# Patient Record
Sex: Male | Born: 1950 | Race: White | Hispanic: No | Marital: Single | Smoking: Never smoker
Health system: Southern US, Community
[De-identification: ages and names within clinical notes are randomized; demographics above are authoritative.]

---

## 2020-03-07 ENCOUNTER — Encounter (HOSPITAL_COMMUNITY): Payer: Self-pay | Admitting: Emergency Medicine

## 2020-03-07 ENCOUNTER — Inpatient Hospital Stay (HOSPITAL_COMMUNITY)
Admission: EM | Admit: 2020-03-07 | Discharge: 2020-03-15 | DRG: 853 | Disposition: A | Discharge status: Home / Self Care | Payer: Self-pay | Attending: Internal Medicine | Admitting: Internal Medicine

## 2020-03-07 ENCOUNTER — Emergency Department (HOSPITAL_COMMUNITY): Payer: Self-pay

## 2020-03-07 ENCOUNTER — Other Ambulatory Visit: Payer: Self-pay

## 2020-03-07 DIAGNOSIS — K8050 Calculus of bile duct without cholangitis or cholecystitis without obstruction: Secondary | ICD-10-CM

## 2020-03-07 DIAGNOSIS — R748 Abnormal levels of other serum enzymes: Secondary | ICD-10-CM | POA: Diagnosis present

## 2020-03-07 DIAGNOSIS — J150 Pneumonia due to Klebsiella pneumoniae: Secondary | ICD-10-CM | POA: Diagnosis present

## 2020-03-07 DIAGNOSIS — K651 Peritoneal abscess: Secondary | ICD-10-CM | POA: Diagnosis present

## 2020-03-07 DIAGNOSIS — E871 Hypo-osmolality and hyponatremia: Secondary | ICD-10-CM | POA: Diagnosis present

## 2020-03-07 DIAGNOSIS — A419 Sepsis, unspecified organism: Secondary | ICD-10-CM

## 2020-03-07 DIAGNOSIS — I10 Essential (primary) hypertension: Secondary | ICD-10-CM | POA: Diagnosis present

## 2020-03-07 DIAGNOSIS — K8689 Other specified diseases of pancreas: Secondary | ICD-10-CM

## 2020-03-07 DIAGNOSIS — E872 Acidosis: Secondary | ICD-10-CM | POA: Diagnosis present

## 2020-03-07 DIAGNOSIS — R739 Hyperglycemia, unspecified: Secondary | ICD-10-CM | POA: Diagnosis present

## 2020-03-07 DIAGNOSIS — B9689 Other specified bacterial agents as the cause of diseases classified elsewhere: Secondary | ICD-10-CM | POA: Diagnosis present

## 2020-03-07 DIAGNOSIS — L8990 Pressure ulcer of unspecified site, unspecified stage: Secondary | ICD-10-CM | POA: Diagnosis present

## 2020-03-07 DIAGNOSIS — B961 Klebsiella pneumoniae [K. pneumoniae] as the cause of diseases classified elsewhere: Secondary | ICD-10-CM | POA: Diagnosis present

## 2020-03-07 DIAGNOSIS — K5900 Constipation, unspecified: Secondary | ICD-10-CM | POA: Diagnosis present

## 2020-03-07 DIAGNOSIS — N179 Acute kidney failure, unspecified: Secondary | ICD-10-CM | POA: Diagnosis present

## 2020-03-07 DIAGNOSIS — R4182 Altered mental status, unspecified: Secondary | ICD-10-CM

## 2020-03-07 DIAGNOSIS — A4159 Other Gram-negative sepsis: Principal | ICD-10-CM | POA: Diagnosis present

## 2020-03-07 DIAGNOSIS — E861 Hypovolemia: Secondary | ICD-10-CM | POA: Diagnosis present

## 2020-03-07 DIAGNOSIS — E876 Hypokalemia: Secondary | ICD-10-CM | POA: Diagnosis present

## 2020-03-07 DIAGNOSIS — K838 Other specified diseases of biliary tract: Secondary | ICD-10-CM | POA: Diagnosis present

## 2020-03-07 DIAGNOSIS — R6521 Severe sepsis with septic shock: Secondary | ICD-10-CM | POA: Diagnosis present

## 2020-03-07 DIAGNOSIS — R579 Shock, unspecified: Secondary | ICD-10-CM | POA: Diagnosis present

## 2020-03-07 DIAGNOSIS — K8012 Calculus of gallbladder with acute and chronic cholecystitis without obstruction: Secondary | ICD-10-CM | POA: Diagnosis present

## 2020-03-07 DIAGNOSIS — Z20822 Contact with and (suspected) exposure to covid-19: Secondary | ICD-10-CM | POA: Diagnosis present

## 2020-03-07 DIAGNOSIS — E860 Dehydration: Secondary | ICD-10-CM | POA: Diagnosis present

## 2020-03-07 DIAGNOSIS — R188 Other ascites: Secondary | ICD-10-CM | POA: Diagnosis present

## 2020-03-07 DIAGNOSIS — R7881 Bacteremia: Secondary | ICD-10-CM

## 2020-03-07 DIAGNOSIS — D649 Anemia, unspecified: Secondary | ICD-10-CM | POA: Diagnosis present

## 2020-03-07 DIAGNOSIS — R571 Hypovolemic shock: Secondary | ICD-10-CM | POA: Diagnosis present

## 2020-03-07 DIAGNOSIS — F1010 Alcohol abuse, uncomplicated: Secondary | ICD-10-CM

## 2020-03-07 DIAGNOSIS — K812 Acute cholecystitis with chronic cholecystitis: Secondary | ICD-10-CM

## 2020-03-07 DIAGNOSIS — Z66 Do not resuscitate: Secondary | ICD-10-CM | POA: Diagnosis present

## 2020-03-07 DIAGNOSIS — D6959 Other secondary thrombocytopenia: Secondary | ICD-10-CM | POA: Diagnosis present

## 2020-03-07 DIAGNOSIS — K660 Peritoneal adhesions (postprocedural) (postinfection): Secondary | ICD-10-CM | POA: Diagnosis present

## 2020-03-07 LAB — URINALYSIS, ROUTINE W REFLEX MICROSCOPIC
Bilirubin Urine: NEGATIVE
Glucose, UA: NEGATIVE mg/dL
Ketones, ur: NEGATIVE mg/dL
Leukocytes,Ua: NEGATIVE
Nitrite: NEGATIVE
Protein, ur: 30 mg/dL — AB
Specific Gravity, Urine: 1.015 (ref 1.005–1.030)
pH: 5 (ref 5.0–8.0)

## 2020-03-07 LAB — CBC WITH DIFFERENTIAL/PLATELET
Abs Immature Granulocytes: 0.1 10*3/uL — ABNORMAL HIGH (ref 0.00–0.07)
Band Neutrophils: 21 %
Basophils Absolute: 0.1 10*3/uL (ref 0.0–0.1)
Basophils Relative: 1 %
Eosinophils Absolute: 0 10*3/uL (ref 0.0–0.5)
Eosinophils Relative: 0 %
HCT: 44 % (ref 39.0–52.0)
Hemoglobin: 14.6 g/dL (ref 13.0–17.0)
Lymphocytes Relative: 2 %
Lymphs Abs: 0.2 10*3/uL — ABNORMAL LOW (ref 0.7–4.0)
MCH: 30.1 pg (ref 26.0–34.0)
MCHC: 33.2 g/dL (ref 30.0–36.0)
MCV: 90.7 fL (ref 80.0–100.0)
Metamyelocytes Relative: 1 %
Monocytes Absolute: 0.4 10*3/uL (ref 0.1–1.0)
Monocytes Relative: 5 %
Neutro Abs: 7.5 10*3/uL (ref 1.7–7.7)
Neutrophils Relative %: 70 %
Platelets: 287 10*3/uL (ref 150–400)
RBC: 4.85 MIL/uL (ref 4.22–5.81)
RDW: 12.8 % (ref 11.5–15.5)
WBC Morphology: INCREASED
WBC: 8.2 10*3/uL (ref 4.0–10.5)
nRBC: 0 % (ref 0.0–0.2)

## 2020-03-07 LAB — COMPREHENSIVE METABOLIC PANEL
ALT: 434 U/L — ABNORMAL HIGH (ref 0–44)
ALT: 231 U/L — ABNORMAL HIGH (ref 0–44)
AST: 275 U/L — ABNORMAL HIGH (ref 15–41)
AST: 137 U/L — ABNORMAL HIGH (ref 15–41)
Albumin: 3.3 g/dL — ABNORMAL LOW (ref 3.5–5.0)
Albumin: 2.2 g/dL — ABNORMAL LOW (ref 3.5–5.0)
Alkaline Phosphatase: 160 U/L — ABNORMAL HIGH (ref 38–126)
Alkaline Phosphatase: 112 U/L (ref 38–126)
Anion gap: 24 — ABNORMAL HIGH (ref 5–15)
Anion gap: 14 (ref 5–15)
BUN: 43 mg/dL — ABNORMAL HIGH (ref 8–23)
BUN: 43 mg/dL — ABNORMAL HIGH (ref 8–23)
CO2: 17 mmol/L — ABNORMAL LOW (ref 22–32)
CO2: 21 mmol/L — ABNORMAL LOW (ref 22–32)
Calcium: 8.3 mg/dL — ABNORMAL LOW (ref 8.9–10.3)
Calcium: 7.4 mg/dL — ABNORMAL LOW (ref 8.9–10.3)
Chloride: 82 mmol/L — ABNORMAL LOW (ref 98–111)
Chloride: 91 mmol/L — ABNORMAL LOW (ref 98–111)
Creatinine, Ser: 1.98 mg/dL — ABNORMAL HIGH (ref 0.61–1.24)
Creatinine, Ser: 1.67 mg/dL — ABNORMAL HIGH (ref 0.61–1.24)
GFR calc Af Amer: 39 mL/min — ABNORMAL LOW (ref >60)
GFR calc Af Amer: 48 mL/min — ABNORMAL LOW (ref >60)
GFR calc non Af Amer: 34 mL/min — ABNORMAL LOW (ref >60)
GFR calc non Af Amer: 42 mL/min — ABNORMAL LOW (ref >60)
Glucose, Bld: 167 mg/dL — ABNORMAL HIGH (ref 70–99)
Glucose, Bld: 111 mg/dL — ABNORMAL HIGH (ref 70–99)
Potassium: 3.3 mmol/L — ABNORMAL LOW (ref 3.5–5.1)
Potassium: 3 mmol/L — ABNORMAL LOW (ref 3.5–5.1)
Sodium: 123 mmol/L — ABNORMAL LOW (ref 135–145)
Sodium: 126 mmol/L — ABNORMAL LOW (ref 135–145)
Total Bilirubin: 6.8 mg/dL — ABNORMAL HIGH (ref 0.3–1.2)
Total Bilirubin: 3.1 mg/dL — ABNORMAL HIGH (ref 0.3–1.2)
Total Protein: 7.4 g/dL (ref 6.5–8.1)
Total Protein: 5.2 g/dL — ABNORMAL LOW (ref 6.5–8.1)

## 2020-03-07 LAB — I-STAT CHEM 8, ED
BUN: 40 mg/dL — ABNORMAL HIGH (ref 8–23)
Calcium, Ion: 0.83 mmol/L — CL (ref 1.15–1.40)
Chloride: 91 mmol/L — ABNORMAL LOW (ref 98–111)
Creatinine, Ser: 1.7 mg/dL — ABNORMAL HIGH (ref 0.61–1.24)
Glucose, Bld: 91 mg/dL (ref 70–99)
HCT: 38 % — ABNORMAL LOW (ref 39.0–52.0)
Hemoglobin: 12.9 g/dL — ABNORMAL LOW (ref 13.0–17.0)
Potassium: 2.9 mmol/L — ABNORMAL LOW (ref 3.5–5.1)
Sodium: 125 mmol/L — ABNORMAL LOW (ref 135–145)
TCO2: 15 mmol/L — ABNORMAL LOW (ref 22–32)

## 2020-03-07 LAB — CBC
HCT: 35.2 % — ABNORMAL LOW (ref 39.0–52.0)
Hemoglobin: 12.3 g/dL — ABNORMAL LOW (ref 13.0–17.0)
MCH: 31.1 pg (ref 26.0–34.0)
MCHC: 34.9 g/dL (ref 30.0–36.0)
MCV: 89.1 fL (ref 80.0–100.0)
Platelets: 146 10*3/uL — ABNORMAL LOW (ref 150–400)
RBC: 3.95 MIL/uL — ABNORMAL LOW (ref 4.22–5.81)
RDW: 12.8 % (ref 11.5–15.5)
WBC: 13.8 10*3/uL — ABNORMAL HIGH (ref 4.0–10.5)
nRBC: 0 % (ref 0.0–0.2)

## 2020-03-07 LAB — LACTIC ACID, PLASMA
Lactic Acid, Venous: 8.5 mmol/L (ref 0.5–1.9)
Lactic Acid, Venous: 5 mmol/L (ref 0.5–1.9)
Lactic Acid, Venous: 3.4 mmol/L (ref 0.5–1.9)
Lactic Acid, Venous: 3 mmol/L (ref 0.5–1.9)

## 2020-03-07 LAB — HEPATITIS PANEL, ACUTE
HCV Ab: NONREACTIVE
Hep A IgM: NONREACTIVE
Hep B C IgM: NONREACTIVE
Hepatitis B Surface Ag: NONREACTIVE

## 2020-03-07 LAB — PROTIME-INR
INR: 1.6 — ABNORMAL HIGH (ref 0.8–1.2)
Prothrombin Time: 18.7 seconds — ABNORMAL HIGH (ref 11.4–15.2)

## 2020-03-07 LAB — CREATININE, SERUM
Creatinine, Ser: 1.87 mg/dL — ABNORMAL HIGH (ref 0.61–1.24)
GFR calc Af Amer: 42 mL/min — ABNORMAL LOW (ref >60)
GFR calc non Af Amer: 36 mL/min — ABNORMAL LOW (ref >60)

## 2020-03-07 LAB — ACETAMINOPHEN LEVEL: Acetaminophen (Tylenol), Serum: <10 ug/mL — ABNORMAL LOW (ref 10–30)

## 2020-03-07 LAB — CK: Total CK: 528 U/L — ABNORMAL HIGH (ref 49–397)

## 2020-03-07 LAB — SARS CORONAVIRUS 2 BY RT PCR (HOSPITAL ORDER, PERFORMED IN ~~LOC~~ HOSPITAL LAB): SARS Coronavirus 2: NEGATIVE

## 2020-03-07 LAB — HIV ANTIBODY (ROUTINE TESTING W REFLEX): HIV Screen 4th Generation wRfx: NONREACTIVE

## 2020-03-07 LAB — APTT: aPTT: 37 seconds — ABNORMAL HIGH (ref 24–36)

## 2020-03-07 MED ORDER — ACETAMINOPHEN 325 MG PO TABS
650.0000 mg | ORAL_TABLET | Freq: Once | ORAL | Status: AC
  Administered 2020-03-07: 650 mg via ORAL
  Filled 2020-03-07: qty 2

## 2020-03-07 MED ORDER — METRONIDAZOLE IN NACL 5-0.79 MG/ML-% IV SOLN
500.0000 mg | Freq: Three times a day (TID) | INTRAVENOUS | Status: DC
  Administered 2020-03-07 – 2020-03-14 (×20): 500 mg via INTRAVENOUS
  Filled 2020-03-07 (×20): qty 100

## 2020-03-07 MED ORDER — SODIUM CHLORIDE 0.9 % IV BOLUS (SEPSIS)
1000.0000 mL | Freq: Once | INTRAVENOUS | Status: AC
  Administered 2020-03-07: 1000 mL via INTRAVENOUS

## 2020-03-07 MED ORDER — ONDANSETRON HCL 4 MG/2ML IJ SOLN: 4.0000 mg | Freq: Four times a day (QID) | INTRAMUSCULAR | Status: DC | PRN

## 2020-03-07 MED ORDER — METRONIDAZOLE IN NACL 5-0.79 MG/ML-% IV SOLN
500.0000 mg | Freq: Once | INTRAVENOUS | Status: AC
  Administered 2020-03-07: 500 mg via INTRAVENOUS
  Filled 2020-03-07: qty 100

## 2020-03-07 MED ORDER — VANCOMYCIN HCL IN DEXTROSE 1-5 GM/200ML-% IV SOLN
1000.0000 mg | Freq: Once | INTRAVENOUS | Status: DC
  Filled 2020-03-07: qty 200

## 2020-03-07 MED ORDER — SODIUM CHLORIDE 0.9 % IV BOLUS (SEPSIS)
500.0000 mL | Freq: Once | INTRAVENOUS | Status: AC
  Administered 2020-03-07: 500 mL via INTRAVENOUS

## 2020-03-07 MED ORDER — FENTANYL CITRATE (PF) 100 MCG/2ML IJ SOLN
25.0000 ug | Freq: Once | INTRAMUSCULAR | Status: AC
  Administered 2020-03-07: 25 ug via INTRAVENOUS
  Filled 2020-03-07: qty 2

## 2020-03-07 MED ORDER — VANCOMYCIN HCL 1500 MG/300ML IV SOLN
1500.0000 mg | Freq: Once | INTRAVENOUS | Status: AC
  Administered 2020-03-07: 1500 mg via INTRAVENOUS
  Filled 2020-03-07: qty 300

## 2020-03-07 MED ORDER — SODIUM CHLORIDE 0.9 % IV SOLN
2.0000 g | INTRAVENOUS | Status: DC
  Administered 2020-03-07 – 2020-03-13 (×7): 2 g via INTRAVENOUS
  Filled 2020-03-07 (×3): qty 20
  Filled 2020-03-07: qty 2
  Filled 2020-03-07: qty 20
  Filled 2020-03-07 (×2): qty 2
  Filled 2020-03-07: qty 20

## 2020-03-07 MED ORDER — SODIUM CHLORIDE 0.9 % IV BOLUS
1000.0000 mL | Freq: Once | INTRAVENOUS | Status: AC
  Administered 2020-03-07: 1000 mL via INTRAVENOUS

## 2020-03-07 MED ORDER — SODIUM CHLORIDE 0.9 % IV SOLN: 2.0000 g | Freq: Two times a day (BID) | INTRAVENOUS | Status: DC

## 2020-03-07 MED ORDER — LACTATED RINGERS IV SOLN: INTRAVENOUS | Status: AC

## 2020-03-07 MED ORDER — CALCIUM GLUCONATE-NACL 2-0.675 GM/100ML-% IV SOLN
2.0000 g | Freq: Once | INTRAVENOUS | Status: AC
  Administered 2020-03-07: 2000 mg via INTRAVENOUS
  Filled 2020-03-07: qty 100

## 2020-03-07 MED ORDER — SODIUM CHLORIDE 0.9 % IV SOLN
2.0000 g | Freq: Once | INTRAVENOUS | Status: AC
  Administered 2020-03-07: 2 g via INTRAVENOUS
  Filled 2020-03-07: qty 2

## 2020-03-07 MED ORDER — DOCUSATE SODIUM 100 MG PO CAPS: 100.0000 mg | ORAL_CAPSULE | Freq: Two times a day (BID) | ORAL | Status: DC | PRN

## 2020-03-07 MED ORDER — LACTATED RINGERS IV BOLUS
1000.0000 mL | Freq: Once | INTRAVENOUS | Status: AC
  Administered 2020-03-07: 1000 mL via INTRAVENOUS

## 2020-03-07 MED ORDER — ORAL CARE MOUTH RINSE
15.0000 mL | Freq: Two times a day (BID) | OROMUCOSAL | Status: DC
  Administered 2020-03-07 – 2020-03-15 (×10): 15 mL via OROMUCOSAL

## 2020-03-07 MED ORDER — HEPARIN SODIUM (PORCINE) 5000 UNIT/ML IJ SOLN
5000.0000 [IU] | Freq: Three times a day (TID) | INTRAMUSCULAR | Status: DC
  Administered 2020-03-07 – 2020-03-08 (×3): 5000 [IU] via SUBCUTANEOUS
  Filled 2020-03-07 (×3): qty 1

## 2020-03-07 MED ORDER — VANCOMYCIN HCL 1250 MG/250ML IV SOLN: 1250.0000 mg | INTRAVENOUS | Status: DC

## 2020-03-07 MED ORDER — POTASSIUM CHLORIDE CRYS ER 20 MEQ PO TBCR
40.0000 meq | EXTENDED_RELEASE_TABLET | Freq: Once | ORAL | Status: AC
  Administered 2020-03-07: 40 meq via ORAL
  Filled 2020-03-07: qty 2

## 2020-03-07 MED ORDER — MORPHINE SULFATE (PF) 2 MG/ML IV SOLN
2.0000 mg | INTRAVENOUS | Status: DC | PRN
  Administered 2020-03-07 – 2020-03-13 (×12): 2 mg via INTRAVENOUS
  Filled 2020-03-07 (×13): qty 1

## 2020-03-07 MED ORDER — ONDANSETRON HCL 4 MG/2ML IJ SOLN
4.0000 mg | Freq: Once | INTRAMUSCULAR | Status: AC
  Administered 2020-03-07: 4 mg via INTRAVENOUS
  Filled 2020-03-07: qty 2

## 2020-03-07 MED ORDER — NOREPINEPHRINE 4 MG/250ML-% IV SOLN
0.0000 ug/min | INTRAVENOUS | Status: DC
  Administered 2020-03-07: 2 ug/min via INTRAVENOUS
  Filled 2020-03-07 (×2): qty 250

## 2020-03-07 MED ORDER — POLYETHYLENE GLYCOL 3350 17 G PO PACK: 17.0000 g | PACK | Freq: Every day | ORAL | Status: DC | PRN

## 2020-03-07 NOTE — H&P (Signed)
NAME:  Brandon Spence, MRN:  [id number], DOB:  Jul 23, 1951, LOS: 0 ADMISSION DATE:  03/07/2020, CONSULTATION DATE:  03/07/20 REFERRING MD: Rogene Houston - EM , CHIEF COMPLAINT:  Weakness, dehydration, recent prolonged sun exposure   Brief History   69 yo presented to ED after feeling ill x several days following prolonged sun exposure. Sepsis criteria in ED, started on abx. Jaundice -- CT a/p with biliary duct dilatation, pancreatic lesion Remains somewhat hypotensive PCCM for admit  History of present illness   Brandon Spence is a 69 year old male without known PMH who presents to ED 8/15 with dark urine following progressive feelings of general unwellness, weakness, fatigue x 3 days after prolonged sun exposure. He also reported severe cramping abdominal pain that started on 8/14. The pain was diffuse and intermittent.   In ED, met sepsis criteria and was resuscitated with 30cc/kg, started on empiric abx. Initial labs reveal myriad of metabolic derrangements including Na 123 K 3.3 Co2 17 Cr 1.98 AG 24 Alk phos 160 AST 275 ALT 434 Tbili 6.8. On exam he was jaundiced and sent for CT abdomen pelvis which revealed intra and extrahepatic biliary ductal dilation, CBD measuring 32m. There is a hypoecoic lesion in the pancreatic head measuring 2.8 x 1.2cm.   Patient is complaining of fatigue, belching and some abdominal pain. Blood pressure remained 80/60 despite fluid resuscitation.   Past Medical History  [facility]   8/15 presents to ED, volume resuscitated and started on abx for possible sepsis, admitting to ICU   Consults:  GI   Procedures:    Significant Diagnostic Tests:  8/15 CT a/p noncon> intra and extrahepatic biliary ductal dilatation, common bile duct 280mwithout definite obstructing lesion. Pancreatic head with hypoechoic lesion 2.8cm x 1.2 cm (mass vs dilated portion of main pancreatic duct?) Moderate amount of fluid between stomach and liver.   Micro  Data:  8/15 SARS Cov2 neg 8/15 BCx> 8/15 UA>  Antimicrobials:  8/15 vanc 8/15 cefepime  8/15 Flagyl>> 8/15 ceftriaxone>> Interim history/subjective:  Initial improvement in BP following IVF. Soft again after pain medication administration.  Intermittent abdominal cramping.  Objective   Blood pressure 105/72, pulse (!) 102, temperature 98.9 F (37.2 C), temperature source Oral, resp. rate (!) 28, height 6' (1.829 m), weight 81.6 kg, SpO2 91 %.        Intake/Output Summary (Last 24 hours) at 03/07/2020 1646 Last data filed at 03/07/2020 1630 Gross per 24 hour  Intake 3000 ml  Output --  Net 3000 ml   Filed Weights   03/07/20 1045  Weight: 81.6 kg    Examination: General: Ill appearing adult M, reclined in ED stretcher. Fatigued appearing HENT: NCAT. Icteric sclera. Mild facial edema with sunburn  Lungs: CTA bilaterally shallow even unlabored respirations, slightly elevated RR  Cardiovascular: tachycardic rate reg rhythm. s1s2 no rgm cap refill < 3 sec Abdomen: Round, soft, No increased tenderness to palpation  Extremities: Symmetrical bulk and tone no obvious joint deformity no cyanosis or clubbing no edema Neuro: Drowsy, awakens to voice follows commands. Intermittently confused. PERRL GU: wnl Skin: blanching sunburn to face chest arms, no blistering. Jaundice of hands. + skin turgor  Resolved Hospital Problem list     Assessment & Plan:  Brandon Spence a [age]ear old male with no past medical history who presents with cramping abdominal pain, fatigue, generalized weakness and dark urine. He is found to have dilated intra/extrahepatic biliary ductal dilation along with  a 2.8 x 1.2cm lesion on the head of the pancreas.   Shock Hypovolemic shock in setting of dehydration (prolonged sun exposure ?sun poisoning), possible component of septic shock from biliary source -continue IVF. Received 30cc/kg resusc in ED per sepsis protocol. Will order additional bolus now and  continue mIVF as clinically appears dehydrated - Will order levophed which can be started low dose peripherally if needed to maintain MAP goal -MAP goal > 65  -Follow cx data -Received cefepime, flagyl and vanc in the ED. - Continue ceftriaxone and flagyl for biliary source coverage    Biliary ductal dilatation In setting of pancreatic lesion leading to hyperbilirubinemia, jaundiced appearence and elevated liver enzymes - Gastroenterology consulted for further evaluation and recommendations. Will aid in decision between MRCP vs ERCP.  -continue IVF as above  -trend LFTs, Tbili, coags  - Pain control 66m morphine IV PRN moderate pain  Anion Gap Metabolic Acidosis In setting of shock, sepsis and lactic acidosis  - IVF and antibiotics as above  Acute Kidney Injury Likely pre-renal given volume status and in setting of shock - IVF as above  Hyponatremia, hypovolemic - Fluid resuscitation as above  Hypokalemia -replete potassium as needed  Hypocalcemia - give 1g calcium gluconate  Disposition: Admit to ICU. Patient wishes to be DNR/DNI but in the event a procedure is needed he is ok with reversing his code status.  Best practice:  Diet: Sips with meds, ice chips  Pain/Anxiety/Delirium protocol (if indicated): PRN VAP protocol (if indicated):na DVT prophylaxis: SQ heparin GI prophylaxis: protonix  Glucose control: monitor Mobility: BR Code Status: DNR/DNI, will reverse code status if needed for procedures. Family Communication: pending 8/15 Disposition: admit to ICU   Labs   CBC: Recent Labs  Lab 03/07/20 1051 03/07/20 1637  WBC 8.2  --   NEUTROABS 7.5  --   HGB 14.6 12.9*  HCT 44.0 38.0*  MCV 90.7  --   PLT 287  --     Basic Metabolic Panel: Recent Labs  Lab 03/07/20 1051 03/07/20 1637  NA 123* 125*  K 3.3* 2.9*  CL 82* 91*  CO2 17*  --   GLUCOSE 167* 91  BUN 43* 40*  CREATININE 1.98* 1.70*  CALCIUM 8.3*  --    GFR: Estimated Creatinine Clearance:  46.3 mL/min (A) (by C-G formula based on SCr of 1.7 mg/dL (H)). Recent Labs  Lab 03/07/20 1051 03/07/20 1322  WBC 8.2  --   LATICACIDVEN 8.5* 5.0*    Liver Function Tests: Recent Labs  Lab 03/07/20 1051  AST 275*  ALT 434*  ALKPHOS 160*  BILITOT 6.8*  PROT 7.4  ALBUMIN 3.3*   No results for input(s): LIPASE, AMYLASE in the last 168 hours. No results for input(s): AMMONIA in the last 168 hours.  ABG    Component Value Date/Time   TCO2 15 (L) 03/07/2020 1637     Coagulation Profile: No results for input(s): INR, PROTIME in the last 168 hours.  Cardiac Enzymes: Recent Labs  Lab 03/07/20 1247  CKTOTAL 528*    HbA1C: No results found for: HGBA1C  CBG: No results for input(s): GLUCAP in the last 168 hours.  Review of Systems:   Limited due to altered mental status in setting of critical illness, analgesic medication administration  Endorses Fatigue, weakness.  Denies HA, Dizziness Endorses cramping GI pain, nausea. Denies v/d/c Endorses dark urine, decreased urination. Denies painful urination Endorses changes to skin (yellowing), sunburn. Denies unintentional weight loss Denies SOB URI sx  Denies chest pain palpitations    Past Medical History  He,  has no past medical history on file.   Surgical History   History reviewed. No pertinent surgical history.   Social History   does not have a smoking history on file. He has never used smokeless tobacco. He reports current alcohol use. He reports previous drug use.   Family History   His family history is not on file.   Allergies Not on File   Home Medications  Prior to Admission medications   Not on File     Critical care time: 40     Critical Care Time devoted to patient care services, exclusive of separately billable procedures, described in this note is 45mnutes.   JFreda Jackson MD [facility] Office: [phone]  See Amion for Pager Details

## 2020-03-07 NOTE — Progress Notes (Signed)
Pharmacy Antibiotic Note  Brandon Spence is a 69 y.o. male admitted on 03/07/2020 with potential sepsis. Patient is febrile and has elevated lactic acid.  Pharmacy has been consulted for vancomycin and cefepime dosing.  Plan: Vancomycin 1500 mg IV once, followed by Vancomycin 1250 IV every 24 hours.  Goal trough 15-20 mcg/mL.  Cefepime 2 g IV every 12 hours  Height: 6' (182.9 cm) Weight: 81.6 kg (180 lb) IBW/kg (Calculated) : 77.6  Temp (24hrs), Avg:101.1 F (38.4 C), Min:101.1 F (38.4 C), Max:101.1 F (38.4 C)  Recent Labs  Lab 03/07/20 1051  WBC 8.2  CREATININE 1.98*  LATICACIDVEN 8.5*    Estimated Creatinine Clearance: 39.7 mL/min (A) (by C-G formula based on SCr of 1.98 mg/dL (H)).    Antimicrobials this admission: Vancomycin 8/15 >>  Cefepime 8/15 >>   Microbiology results: 8/15 BCx: pending  Thank you for allowing pharmacy to be a part of this patient's care.  Rexford Maus, PharmD PGY-1 Acute Care Pharmacy Resident 03/07/2020 1:12 PM

## 2020-03-07 NOTE — ED Triage Notes (Signed)
Pt. Stated, I probably had a heat stroke 2 days ago, I was working outside and got too hot and now I feel bad , my urine is dark

## 2020-03-07 NOTE — ED Provider Notes (Signed)
[facility] EMERGENCY DEPARTMENT Provider Note   CSN: [id number] Arrival date & time: 03/07/20  4098     History Chief Complaint  Patient presents with  . Dehydration    Brandon Spence is a 69 y.o. male.  Patient came in by POV.  Patient is concerned that he had a heatstroke 2 days ago.  He was working outside and got too hot now he feels bad says his urine is dark.  However patient also states he felt like he has had a fever for more than 2 days.  Just starting yesterday with crampy abdominal pain.  Had some intermittently prior to that.  But is gotten more intense today.  States nausea but no vomiting.  No diarrhea.  No shortness of breath no cough.  Past medical history noncontributory.  Patient on presentation vital signs meet sepsis criteria.  Tachycardic fever to 101.  Sugar in triage was 120 but when he got back to the room it was in the 70s.  But patient alert and mentating fine.  Says he does feel little bit lightheaded.  Patient denies taking any Tylenol for the past few days.  Denies any excessive alcohol intake.        History reviewed. No pertinent past medical history.  Patient Active Problem List   Diagnosis Date Noted  . Shock (HCC) 03/07/2020    History reviewed. No pertinent surgical history.     No family history on file.  Social History   Tobacco Use  . Smoking status: Not on file  . Smokeless tobacco: Never Used  Substance Use Topics  . Alcohol use: Yes  . Drug use: Not Currently    Home Medications Prior to Admission medications   Not on File    Allergies    Patient has no allergy information on record.  Review of Systems   Review of Systems  Constitutional: Positive for fever. Negative for chills.  HENT: Negative for rhinorrhea and sore throat.   Eyes: Negative for visual disturbance.  Respiratory: Negative for cough and shortness of breath.   Cardiovascular: Negative for chest pain and leg swelling.    Gastrointestinal: Positive for abdominal pain and nausea. Negative for diarrhea and vomiting.  Genitourinary: Positive for hematuria. Negative for dysuria.  Musculoskeletal: Negative for back pain and neck pain.  Skin: Negative for rash.  Neurological: Positive for light-headedness. Negative for dizziness and headaches.  Hematological: Does not bruise/bleed easily.  Psychiatric/Behavioral: Negative for confusion.    Physical Exam Updated Vital Signs BP 105/72 (BP Location: Right Arm)   Pulse (!) 102   Temp 98.9 F (37.2 C) (Oral)   Resp (!) 28   Ht 1.829 m (6')   Wt 81.6 kg   SpO2 91%   BMI 24.41 kg/m   Physical Exam Vitals and nursing note reviewed.  Constitutional:      Appearance: Normal appearance. He is well-developed.  HENT:     Head: Normocephalic and atraumatic.  Eyes:     General: Scleral icterus present.     Extraocular Movements: Extraocular movements intact.     Conjunctiva/sclera: Conjunctivae normal.     Pupils: Pupils are equal, round, and reactive to light.  Cardiovascular:     Rate and Rhythm: Normal rate and regular rhythm.     Heart sounds: No murmur heard.   Pulmonary:     Effort: Pulmonary effort is normal. No respiratory distress.     Breath sounds: Normal breath sounds.  Abdominal:  Palpations: Abdomen is soft.     Tenderness: There is no abdominal tenderness.  Musculoskeletal:        General: Normal range of motion.     Cervical back: Neck supple.  Skin:    General: Skin is warm and dry.     Coloration: Skin is jaundiced.  Neurological:     General: No focal deficit present.     Mental Status: He is alert and oriented to person, place, and time.     Cranial Nerves: No cranial nerve deficit.     Sensory: No sensory deficit.     Motor: No weakness.     ED Results / Procedures / Treatments   Labs (all labs ordered are listed, but only abnormal results are displayed) Labs Reviewed  LACTIC ACID, PLASMA - Abnormal; Notable for the  following components:      Result Value   Lactic Acid, Venous 8.5 (*)    All other components within normal limits  LACTIC ACID, PLASMA - Abnormal; Notable for the following components:   Lactic Acid, Venous 5.0 (*)    All other components within normal limits  COMPREHENSIVE METABOLIC PANEL - Abnormal; Notable for the following components:   Sodium 123 (*)    Potassium 3.3 (*)    Chloride 82 (*)    CO2 17 (*)    Glucose, Bld 167 (*)    BUN 43 (*)    Creatinine, Ser 1.98 (*)    Calcium 8.3 (*)    Albumin 3.3 (*)    AST 275 (*)    ALT 434 (*)    Alkaline Phosphatase 160 (*)    Total Bilirubin 6.8 (*)    GFR calc non Af Amer 34 (*)    GFR calc Af Amer 39 (*)    Anion gap 24 (*)    All other components within normal limits  CBC WITH DIFFERENTIAL/PLATELET - Abnormal; Notable for the following components:   Lymphs Abs 0.2 (*)    Abs Immature Granulocytes 0.10 (*)    All other components within normal limits  CK - Abnormal; Notable for the following components:   Total CK 528 (*)    All other components within normal limits  ACETAMINOPHEN LEVEL - Abnormal; Notable for the following components:   Acetaminophen (Tylenol), Serum <10 (*)    All other components within normal limits  I-STAT CHEM 8, ED - Abnormal; Notable for the following components:   Sodium 125 (*)    Potassium 2.9 (*)    Chloride 91 (*)    BUN 40 (*)    Creatinine, Ser 1.70 (*)    Calcium, Ion 0.83 (*)    TCO2 15 (*)    Hemoglobin 12.9 (*)    HCT 38.0 (*)    All other components within normal limits  SARS CORONAVIRUS 2 BY RT PCR (HOSPITAL ORDER, PERFORMED IN [facility])  CULTURE, BLOOD (ROUTINE X 2)  CULTURE, BLOOD (ROUTINE X 2)  URINE CULTURE  HEPATITIS PANEL, ACUTE  URINALYSIS, ROUTINE W REFLEX MICROSCOPIC  PROTIME-INR  APTT  COMPREHENSIVE METABOLIC PANEL  HIV ANTIBODY (ROUTINE TESTING W REFLEX)  CBC  CREATININE, SERUM    EKG EKG Interpretation  Date/Time:  Sunday March 07 2020  10:22:41 EDT Ventricular Rate:  119 PR Interval:  172 QRS Duration: 98 QT Interval:  326 QTC Calculation: 458 R Axis:   -56 Text Interpretation: Sinus tachycardia Incomplete right bundle branch block Left anterior fascicular block Cannot rule out Inferior infarct (masked  by fascicular block?) , age undetermined Abnormal ECG No previous ECGs available Confirmed by Vanetta Mulders [phone]) on 03/07/2020 12:51:48 PM   Radiology CT Abdomen Pelvis Wo Contrast  Result Date: 03/07/2020 CLINICAL DATA:  Dark urine. EXAM: CT ABDOMEN AND PELVIS WITHOUT CONTRAST TECHNIQUE: Multidetector CT imaging of the abdomen and pelvis was performed following the standard protocol without IV contrast. COMPARISON:  Same day chest radiograph. FINDINGS: Lower chest: Moderate bibasilar atelectasis is noted. Hepatobiliary: No focal liver abnormality is seen. There is intra and extrahepatic biliary ductal dilatation with the common bile duct measuring up to 21 mm in diameter. No definite obstructing lesion is identified. No gallstones or gallbladder wall thickening. Pancreas: A hypoechoic lesion in the pancreatic head measures 2.8 x 1.2 cm and may represent a mass in the pancreatic head or a dilated portion of the main pancreatic duct (series 6, image 36). The pancreatic duct within the body and tail appears normal given technique. No inflammatory changes are noted surrounding the pancreas. Spleen: Normal in size without focal abnormality. Adrenals/Urinary Tract: Adrenal glands are unremarkable. Kidneys are normal, without renal calculi, focal lesion, or hydronephrosis. Bladder is unremarkable. Stomach/Bowel: Stomach is within normal limits. Appendix appears normal. No evidence of bowel wall thickening, distention, or inflammatory changes. Vascular/Lymphatic: Aortic atherosclerosis. No enlarged abdominal or pelvic lymph nodes. Reproductive: Prostate is unremarkable. Other: No abdominal wall hernia or abnormality. A moderate amount of  fluid is seen between the stomach and the liver (series 3, image 21) and inferior to the stomach (series 3, image 35), likely located within the omentum. Musculoskeletal: Degenerative changes are seen in the spine. IMPRESSION: 1. Intra and extrahepatic biliary ductal dilatation with the common bile duct measuring up to 21 mm in diameter. No definite obstructing lesion is identified. 2. A hypoechoic lesion in the pancreatic head measures 2.8 x 1.2 cm and may represent a mass in the pancreatic head or a dilated portion of the main pancreatic duct. This could be further evaluated with MRCP. 3. A moderate amount of fluid between the stomach and the liver is likely located within the omentum and is nonspecific. Aortic Atherosclerosis (ICD10-I70.0). Electronically Signed   By: Romona Curls M.D.   On: 03/07/2020 14:36   DG Chest 2 View  Result Date: 03/07/2020 CLINICAL DATA:  Patient with heat stroke.  Headache. EXAM: CHEST - 2 VIEW COMPARISON:  None. FINDINGS: Normal cardiac and mediastinal contours. Low lung volumes. Bibasilar heterogeneous opacities. No pleural effusion or pneumothorax. Thoracic spine degenerative changes. IMPRESSION: Low lung volumes with bibasilar atelectasis. Electronically Signed   By: Annia Belt M.D.   On: 03/07/2020 12:19    Procedures Procedures (including critical care time)  CRITICAL CARE Performed by: Vanetta Mulders Total critical care time: 60 minutes Critical care time was exclusive of separately billable procedures and treating other patients. Critical care was necessary to treat or prevent imminent or life-threatening deterioration. Critical care was time spent personally by me on the following activities: development of treatment plan with patient and/or surrogate as well as nursing, discussions with consultants, evaluation of patient's response to treatment, examination of patient, obtaining history from patient or surrogate, ordering and performing treatments and  interventions, ordering and review of laboratory studies, ordering and review of radiographic studies, pulse oximetry and re-evaluation of patient's condition.   Medications Ordered in ED Medications  lactated ringers infusion ( Intravenous New Bag/Given 03/07/20 1347)  ceFEPIme (MAXIPIME) 2 g in sodium chloride 0.9 % 100 mL IVPB (has no administration in time range)  vancomycin (VANCOREADY) IVPB 1250 mg/250 mL (has no administration in time range)  lactated ringers bolus 1,000 mL (has no administration in time range)  docusate sodium (COLACE) capsule 100 mg (has no administration in time range)  polyethylene glycol (MIRALAX / GLYCOLAX) packet 17 g (has no administration in time range)  heparin injection 5,000 Units (has no administration in time range)  ondansetron (ZOFRAN) injection 4 mg (has no administration in time range)  acetaminophen (TYLENOL) tablet 650 mg (650 mg Oral Given 03/07/20 1117)  sodium chloride 0.9 % bolus 1,000 mL (0 mLs Intravenous Stopped 03/07/20 1349)    And  sodium chloride 0.9 % bolus 1,000 mL (0 mLs Intravenous Stopped 03/07/20 1445)    And  sodium chloride 0.9 % bolus 500 mL (0 mLs Intravenous Stopped 03/07/20 1618)  ceFEPIme (MAXIPIME) 2 g in sodium chloride 0.9 % 100 mL IVPB (0 g Intravenous Stopped 03/07/20 1446)  metroNIDAZOLE (FLAGYL) IVPB 500 mg (0 mg Intravenous Stopped 03/07/20 1445)  vancomycin (VANCOREADY) IVPB 1500 mg/300 mL (0 mg Intravenous Stopped 03/07/20 1630)  sodium chloride 0.9 % bolus 1,000 mL (1,000 mLs Intravenous New Bag/Given 03/07/20 1616)  ondansetron (ZOFRAN) injection 4 mg (4 mg Intravenous Given 03/07/20 1624)  fentaNYL (SUBLIMAZE) injection 25 mcg (25 mcg Intravenous Given 03/07/20 1624)    ED Course  I have reviewed the triage vital signs and the nursing notes.  Pertinent labs & imaging results that were available during my care of the patient were reviewed by me and considered in my medical decision making (see chart for details).      MDM Rules/Calculators/A&P                          Patient meets sepsis criteria based on vital signs at presentation.  Hypotensive tachycardic febrile and tachypneic.  Patient jaundiced in appearance sclerae yellow.  Abdomen soft nontender.  Patient denied any Tylenol use or any excessive alcohol use.  Patient started on sepsis protocol.  Received 30 cc/kg of normal saline bolus.  This did bring his blood pressure up to around 100 systolic.  And also brought his heart rate down to the low 100s.  Side to going give an extra liter of fluid.  Liver function tests were markedly abnormal.  Patient was treated with broad-spectrum antibiotics.  Chest x-ray without any acute findings.  Covid testing negative.  Lecture lites with hyponatremia acidosis and acute kidney injury.  As well as potassium at 2.9.  No leukocytosis.  Urine still pending.  Based on the markedly abnormal LFTs elevated bilirubin and his jaundice appearance.  Hepatitis panel was done which was negative.  CT scan of the abdomen was done.  To be done without IV contrast because of his renal function.  This showed markedly dilated common bile duct at 21 mm.  And a mass at the head of the pancreas.  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   [facility]  Rectal care will admit.       Final Clinical Impression(s) / ED Diagnoses Final diagnoses:  Sepsis, due to unspecified organism, unspecified whether acute organ dysfunction present [facility](HCC)  AKI (acute kidney injury) (HCC)  Pancreatic mass    Rx / DC Orders ED Discharge Orders    None       Vanetta MuldersZackowski, Aleece Loyd, MD 03/07/20 1708

## 2020-03-08 ENCOUNTER — Inpatient Hospital Stay (HOSPITAL_COMMUNITY): Payer: Self-pay

## 2020-03-08 DIAGNOSIS — L8990 Pressure ulcer of unspecified site, unspecified stage: Secondary | ICD-10-CM | POA: Insufficient documentation

## 2020-03-08 DIAGNOSIS — R579 Shock, unspecified: Secondary | ICD-10-CM

## 2020-03-08 LAB — MRSA PCR SCREENING: MRSA by PCR: NEGATIVE

## 2020-03-08 LAB — BLOOD CULTURE ID PANEL (REFLEXED) - BCID2

## 2020-03-08 LAB — BASIC METABOLIC PANEL
Anion gap: 13 (ref 5–15)
BUN: 40 mg/dL — ABNORMAL HIGH (ref 8–23)
CO2: 22 mmol/L (ref 22–32)
Calcium: 7.4 mg/dL — ABNORMAL LOW (ref 8.9–10.3)
Chloride: 95 mmol/L — ABNORMAL LOW (ref 98–111)
Creatinine, Ser: 1.63 mg/dL — ABNORMAL HIGH (ref 0.61–1.24)
GFR calc Af Amer: 50 mL/min — ABNORMAL LOW (ref >60)
GFR calc non Af Amer: 43 mL/min — ABNORMAL LOW (ref >60)
Glucose, Bld: 112 mg/dL — ABNORMAL HIGH (ref 70–99)
Potassium: 3.1 mmol/L — ABNORMAL LOW (ref 3.5–5.1)
Sodium: 130 mmol/L — ABNORMAL LOW (ref 135–145)

## 2020-03-08 LAB — URINE CULTURE: Culture: NO GROWTH

## 2020-03-08 LAB — CBC
HCT: 32.2 % — ABNORMAL LOW (ref 39.0–52.0)
Hemoglobin: 11.4 g/dL — ABNORMAL LOW (ref 13.0–17.0)
MCH: 31.5 pg (ref 26.0–34.0)
MCHC: 35.4 g/dL (ref 30.0–36.0)
MCV: 89 fL (ref 80.0–100.0)
Platelets: 129 10*3/uL — ABNORMAL LOW (ref 150–400)
RBC: 3.62 MIL/uL — ABNORMAL LOW (ref 4.22–5.81)
RDW: 13.1 % (ref 11.5–15.5)
WBC: 24.2 10*3/uL — ABNORMAL HIGH (ref 4.0–10.5)
nRBC: 0 % (ref 0.0–0.2)

## 2020-03-08 LAB — PROTIME-INR
INR: 1.7 — ABNORMAL HIGH (ref 0.8–1.2)
Prothrombin Time: 19 seconds — ABNORMAL HIGH (ref 11.4–15.2)

## 2020-03-08 LAB — CK: Total CK: 464 U/L — ABNORMAL HIGH (ref 49–397)

## 2020-03-08 LAB — MAGNESIUM: Magnesium: 1.6 mg/dL — ABNORMAL LOW (ref 1.7–2.4)

## 2020-03-08 LAB — GLUCOSE, CAPILLARY: Glucose-Capillary: 98 mg/dL (ref 70–99)

## 2020-03-08 MED ORDER — METOPROLOL TARTRATE 5 MG/5ML IV SOLN
2.5000 mg | Freq: Once | INTRAVENOUS | Status: AC
  Administered 2020-03-08: 2.5 mg via INTRAVENOUS

## 2020-03-08 MED ORDER — CHLORHEXIDINE GLUCONATE CLOTH 2 % EX PADS
6.0000 | MEDICATED_PAD | Freq: Every day | CUTANEOUS | Status: DC
  Administered 2020-03-07 – 2020-03-15 (×6): 6 via TOPICAL

## 2020-03-08 MED ORDER — AMIODARONE LOAD VIA INFUSION: 150.0000 mg | Freq: Once | INTRAVENOUS | Status: DC

## 2020-03-08 MED ORDER — AMIODARONE IV BOLUS ONLY 150 MG/100ML
150.0000 mg | Freq: Once | INTRAVENOUS | Status: AC
  Administered 2020-03-08: 150 mg via INTRAVENOUS
  Filled 2020-03-08: qty 100

## 2020-03-08 MED ORDER — POTASSIUM CHLORIDE 10 MEQ/100ML IV SOLN
10.0000 meq | INTRAVENOUS | Status: AC
  Administered 2020-03-08 (×6): 10 meq via INTRAVENOUS
  Filled 2020-03-08 (×6): qty 100

## 2020-03-08 MED ORDER — METOPROLOL TARTRATE 5 MG/5ML IV SOLN
INTRAVENOUS | Status: AC
  Filled 2020-03-08: qty 5

## 2020-03-08 MED ORDER — POTASSIUM CHLORIDE 10 MEQ/100ML IV SOLN: 10.0000 meq | INTRAVENOUS | Status: DC

## 2020-03-08 MED ORDER — LACTATED RINGERS IV SOLN: INTRAVENOUS | Status: AC

## 2020-03-08 MED ORDER — GADOBUTROL 1 MMOL/ML IV SOLN
8.5000 mL | Freq: Once | INTRAVENOUS | Status: AC | PRN
  Administered 2020-03-08: 8.5 mL via INTRAVENOUS

## 2020-03-08 NOTE — Progress Notes (Signed)
IR consulted for possible left upper peritoneal cavity abscess drain. Case has been reviewed and procedure approved by Dr. Archer Asa.  Patient tentatively scheduled for 8.17.21.  Team instructed to: Keep Patient to be NPO after midnight Hold prophylactic anticoagulation 24 hours prior to scheduled procedure.   IR will call patient when ready.

## 2020-03-08 NOTE — Progress Notes (Signed)
Pharmacy Electrolyte Replacement  Recent Labs: K 3.1  Recent Labs    03/08/20 0055  K 3.1*  CREATININE 1.63*    Plan: KCl 10 mEq q1hr x6    Calton Dach, PharmD PGY1 Pharmacy Resident 03/08/2020 10:35 AM

## 2020-03-08 NOTE — Progress Notes (Signed)
Patient transported back to room via bed accompanied by SWOT and a transporter. VSS and no distress noted.

## 2020-03-08 NOTE — Progress Notes (Signed)
MRCP reviewed. Gallstones Probable CBD stone Peritoneal fluid collection of uncertain etiology. Discussed with radiology. Discussed with Dr Marca Ancona. We will have IR tap fluid collection to rule out infected fluid collection. He will also need an ERCP most likely, after this fluid collection is tapped. May need a drain. Continue antibiotics.

## 2020-03-08 NOTE — Progress Notes (Signed)
Patient transported to MRI via bed with SWOT RN. No distress noted upon transfer.

## 2020-03-08 NOTE — Progress Notes (Signed)
NAME:  Brandon Spence, MRN:  [id number], DOB:  Nov 04, 1950, LOS: 1 ADMISSION DATE:  03/07/2020, CONSULTATION DATE:  03/07/20 REFERRING MD: Rogene Houston - EM , CHIEF COMPLAINT:  Weakness, dehydration, recent prolonged sun exposure   Brief History   69 yo presented to ED after feeling ill x several days following prolonged sun exposure. Sepsis criteria in ED, started on abx. Jaundice -- CT a/p with biliary duct dilatation, pancreatic lesion Remains somewhat hypotensive PCCM for admit  History of present illness   Brandon Spence is a 69 year old male without known PMH who presents to ED 8/15 with dark urine following progressive feelings of general unwellness, weakness, fatigue x 3 days after prolonged sun exposure. He also reported severe cramping abdominal pain that started on 8/14. The pain was diffuse and intermittent.   In ED, met sepsis criteria and was resuscitated with 30cc/kg, started on empiric abx. Initial labs reveal myriad of metabolic derrangements including Na 123 K 3.3 Co2 17 Cr 1.98 AG 24 Alk phos 160 AST 275 ALT 434 Tbili 6.8. On exam he was jaundiced and sent for CT abdomen pelvis which revealed intra and extrahepatic biliary ductal dilation, CBD measuring 51m. There is a hypoecoic lesion in the pancreatic head measuring 2.8 x 1.2cm.   Patient is complaining of fatigue, belching and some abdominal pain. Blood pressure remained 80/60 despite fluid resuscitation.   Past Medical History  [facility]   8/15 presents to ED, volume resuscitated and started on abx for possible sepsis, admitting to ICU   Consults:  GI   Procedures:    Significant Diagnostic Tests:  8/15 CT a/p noncon> intra and extrahepatic biliary ductal dilatation, common bile duct 261mwithout definite obstructing lesion. Pancreatic head with hypoechoic lesion 2.8cm x 1.2 cm (mass vs dilated portion of main pancreatic duct?) Moderate amount of fluid between stomach and liver.   Micro  Data:  8/15 SARS Cov2 neg 8/15 BCx> 8/15 UA>  Antimicrobials:  8/15 vanc 8/15 cefepime  8/15 Flagyl>> 8/15 ceftriaxone>> Interim history/subjective:  Initial improvement in BP following IVF. Soft again after pain medication administration.  Abdominal cramping persists Objective   Blood pressure 102/69, pulse (!) 45, temperature (!) 97.3 F (36.3 C), temperature source Oral, resp. rate (!) 23, height 6' (1.829 m), weight 88.9 kg, SpO2 94 %.        Intake/Output Summary (Last 24 hours) at 03/08/2020 1137 Last data filed at 03/08/2020 1122 Gross per 24 hour  Intake 9495.7 ml  Output 3225 ml  Net 6270.7 ml   Filed Weights   03/07/20 1045 03/08/20 0341  Weight: 81.6 kg 88.9 kg    Examination: General: Ill appearing adult male HENT: NCAT. Icteric sclera. Lungs: Clear to auscultation bilaterally Cardiovascular: S1-S2 appreciated Abdomen: Round, soft, does not appear tender Extremities: Symmetrical bulk and tone no obvious joint deformity no cyanosis or clubbing no edema Neuro: Alert and interactive, follows commands  GU: wnl Skin: blanching sunburn to face chest arms, no blistering. Jaundice of hands. + skin turgor  Resolved Hospital Problem list     Assessment & Plan:   Shock Hypovolemic shock in setting of dehydration (prolonged sun exposure ?sun poisoning), possible component of septic shock from biliary source -Fluid resuscitated at 30 cc/kg, will continue fluids -Continue pressors, wean for MAP greater than 65 -Follow cx data -Continue Flagyl and ceftriaxone  Biliary ductal dilatation In setting of pancreatic lesion leading to hyperbilirubinemia, jaundiced appearence and elevated liver enzymes - Gastroenterology consulted,  appreciate GI input-MRCP -continue IVF as above  -trend LFTs, Tbili, coags  - Pain control 34m morphine IV PRN moderate pain  Pancreatic mass lesion on CT scan -For MRCP   Anion Gap Metabolic Acidosis In setting of shock, sepsis and  lactic acidosis  - IVF and antibiotics as above  Acute Kidney Injury Likely pre-renal given volume status and in setting of shock - IVF as above  Hyponatremia, hypovolemic - Fluid resuscitation as above  Hypokalemia -replete potassium as needed  Hypocalcemia -Repleted  Continue aggressive resuscitation  Best practice:  Diet: Sips with meds, ice chips  Pain/Anxiety/Delirium protocol (if indicated): PRN VAP protocol (if indicated):na DVT prophylaxis: SQ heparin GI prophylaxis: protonix  Glucose control: monitor Mobility: BR Code Status: DNR/DNI, will reverse code status if needed for procedures. Family Communication: pending 8/15 Disposition: admit to ICU   Labs   CBC: Recent Labs  Lab 03/07/20 1051 03/07/20 1637 03/07/20 1905 03/08/20 0055  WBC 8.2  --  13.8* 24.2*  NEUTROABS 7.5  --   --   --   HGB 14.6 12.9* 12.3* 11.4*  HCT 44.0 38.0* 35.2* 32.2*  MCV 90.7  --  89.1 89.0  PLT 287  --  146* 129*    Basic Metabolic Panel: Recent Labs  Lab 03/07/20 1051 03/07/20 1637 03/07/20 1905 03/07/20 2256 03/08/20 0055  NA 123* 125*  --  126* 130*  K 3.3* 2.9*  --  3.0* 3.1*  CL 82* 91*  --  91* 95*  CO2 17*  --   --  21* 22  GLUCOSE 167* 91  --  111* 112*  BUN 43* 40*  --  43* 40*  CREATININE 1.98* 1.70* 1.87* 1.67* 1.63*  CALCIUM 8.3*  --   --  7.4* 7.4*   GFR: Estimated Creatinine Clearance: 48.3 mL/min (A) (by C-G formula based on SCr of 1.63 mg/dL (H)). Recent Labs  Lab 03/07/20 1051 03/07/20 1322 03/07/20 1905 03/07/20 2256 03/08/20 0055  WBC 8.2  --  13.8*  --  24.2*  LATICACIDVEN 8.5* 5.0* 3.4* 3.0*  --     Liver Function Tests: Recent Labs  Lab 03/07/20 1051 03/07/20 2256  AST 275* 137*  ALT 434* 231*  ALKPHOS 160* 112  BILITOT 6.8* 3.1*  PROT 7.4 5.2*  ALBUMIN 3.3* 2.2*   No results for input(s): LIPASE, AMYLASE in the last 168 hours. No results for input(s): AMMONIA in the last 168 hours.  ABG    Component Value Date/Time    TCO2 15 (L) 03/07/2020 1637     Coagulation Profile: Recent Labs  Lab 03/07/20 2256 03/08/20 0055  INR 1.6* 1.7*    Cardiac Enzymes: Recent Labs  Lab 03/07/20 1247 03/08/20 0055  CKTOTAL 528* 464*    HbA1C: No results found for: HGBA1C  CBG: Recent Labs  Lab 03/07/20 2220  GLUCAP 98    Review of Systems:   Limited due to altered mental status in setting of critical illness, analgesic medication administration  Endorses Fatigue, weakness.  Denies HA, Dizziness Endorses cramping GI pain, nausea. Denies v/d/c Endorses dark urine, decreased urination. Denies painful urination Endorses changes to skin (yellowing), sunburn. Denies unintentional weight loss Denies SOB URI sx Denies chest pain palpitations    Past Medical History  He,  has no past medical history on file.   Surgical History   History reviewed. No pertinent surgical history.   Social History   does not have a smoking history on file. He has never used smokeless tobacco.  He reports current alcohol use. He reports previous drug use.   Family History   His family history is not on file.   Allergies No Known Allergies   Home Medications  Prior to Admission medications   Not on File    The patient is critically ill with multiple organ systems failure and requires high complexity decision making for assessment and support, frequent evaluation and titration of therapies, application of advanced monitoring technologies and extensive interpretation of multiple databases. Critical Care Time devoted to patient care services described in this note independent of APP/resident time (if applicable)  is 32 minutes.   Sherrilyn Rist MD [facility] Personal pager: [phone] If unanswered, please page CCM On-call: [phone]

## 2020-03-08 NOTE — Progress Notes (Signed)
PHARMACY - PHYSICIAN COMMUNICATION CRITICAL VALUE ALERT - BLOOD CULTURE IDENTIFICATION (BCID)  Brandon Spence is an 69 y.o. male who presented to [facility] on 03/07/2020 with a chief complaint of prolonged sun exposure  Assessment:  Bacteremia, likely intra-abdominal source, WBC up today  Name of physician (or Provider) Contacted: Dr. Arsenio Loader  Current antibiotics: Ceftriaxone/Flagyl  Changes to prescribed antibiotics recommended:  Patient is on recommended antibiotics - No changes needed  Results for orders placed or performed during the hospital encounter of 03/07/20  Blood Culture ID Panel (Reflexed) (Collected: 03/07/2020 12:53 PM)  Result Value Ref Range   Enterococcus faecalis NOT DETECTED NOT DETECTED   Enterococcus Faecium NOT DETECTED NOT DETECTED   Listeria monocytogenes NOT DETECTED NOT DETECTED   Staphylococcus species NOT DETECTED NOT DETECTED   Staphylococcus aureus (BCID) NOT DETECTED NOT DETECTED   Staphylococcus epidermidis NOT DETECTED NOT DETECTED   Staphylococcus lugdunensis NOT DETECTED NOT DETECTED   Streptococcus species NOT DETECTED NOT DETECTED   Streptococcus agalactiae NOT DETECTED NOT DETECTED   Streptococcus pneumoniae NOT DETECTED NOT DETECTED   Streptococcus pyogenes NOT DETECTED NOT DETECTED   A.calcoaceticus-baumannii NOT DETECTED NOT DETECTED   Bacteroides fragilis NOT DETECTED NOT DETECTED   Enterobacterales DETECTED (A) NOT DETECTED   Enterobacter cloacae complex NOT DETECTED NOT DETECTED   Escherichia coli NOT DETECTED NOT DETECTED   Klebsiella aerogenes NOT DETECTED NOT DETECTED   Klebsiella oxytoca NOT DETECTED NOT DETECTED   Klebsiella pneumoniae DETECTED (A) NOT DETECTED   Proteus species NOT DETECTED NOT DETECTED   Salmonella species NOT DETECTED NOT DETECTED   Serratia marcescens NOT DETECTED NOT DETECTED   Haemophilus influenzae NOT DETECTED NOT DETECTED   Neisseria meningitidis NOT DETECTED NOT DETECTED   Pseudomonas aeruginosa NOT  DETECTED NOT DETECTED   Stenotrophomonas maltophilia NOT DETECTED NOT DETECTED   Candida albicans NOT DETECTED NOT DETECTED   Candida auris NOT DETECTED NOT DETECTED   Candida glabrata NOT DETECTED NOT DETECTED   Candida krusei NOT DETECTED NOT DETECTED   Candida parapsilosis NOT DETECTED NOT DETECTED   Candida tropicalis NOT DETECTED NOT DETECTED   Cryptococcus neoformans/gattii NOT DETECTED NOT DETECTED   CTX-M ESBL NOT DETECTED NOT DETECTED   Carbapenem resistance IMP NOT DETECTED NOT DETECTED   Carbapenem resistance KPC NOT DETECTED NOT DETECTED   Carbapenem resistance NDM NOT DETECTED NOT DETECTED   Carbapenem resist OXA 48 LIKE NOT DETECTED NOT DETECTED   Carbapenem resistance VIM NOT DETECTED NOT DETECTED   Abran Duke, PharmD, BCPS Clinical Pharmacist Phone: [phone]

## 2020-03-08 NOTE — Progress Notes (Signed)
Patient in AFIB on the monitor in 140's. Dr Wynona Neat made aware and 2.5mg  IVP Lopressor given. VSS. Will continue to monitor.

## 2020-03-08 NOTE — Consult Note (Signed)
Subjective:   HPI  The patient is a 69 year old male who came to the emergency department after feeling ill for several days following prolonged sun exposure.  Sepsis criteria was instituted.  We were asked to see him because of an abnormal CT scan of the abdomen and elevated liver enzymes and jaundice.  Elevated liver enzymes noted.  CT of the abdomen showed intra and extrahepatic biliary ductal dilatation with a common bile duct measuring 21 mm.  There was a hypoechoic lesion in the pancreatic head.  Patient states that he has been having intermittent epigastric pains for some time now and decreased appetite.    History reviewed. No pertinent past medical history. History reviewed. No pertinent surgical history. Social History   Socioeconomic History  . Marital status: Single    Spouse name: Not on file  . Number of children: Not on file  . Years of education: Not on file  . Highest education level: Not on file  Occupational History  . Not on file  Tobacco Use  . Smoking status: Not on file  . Smokeless tobacco: Never Used  Substance and Sexual Activity  . Alcohol use: Yes  . Drug use: Not Currently  . Sexual activity: Not on file  Other Topics Concern  . Not on file  Social History Narrative  . Not on file   Social Determinants of Health   Financial Resource Strain:   . Difficulty of Paying Living Expenses:   Food Insecurity:   . Worried About Programme researcher, broadcasting/film/video in the Last Year:   . Barista in the Last Year:   Transportation Needs:   . Freight forwarder (Medical):   Marland Kitchen Lack of Transportation (Non-Medical):   Physical Activity:   . Days of Exercise per Week:   . Minutes of Exercise per Session:   Stress:   . Feeling of Stress :   Social Connections:   . Frequency of Communication with Friends and Family:   . Frequency of Social Gatherings with Friends and Family:   . Attends Religious Services:   . Active Member of Clubs or Organizations:   .  Attends Banker Meetings:   Marland Kitchen Marital Status:   Intimate Partner Violence:   . Fear of Current or Ex-Partner:   . Emotionally Abused:   Marland Kitchen Physically Abused:   . Sexually Abused:    family history is not on file.  Current Facility-Administered Medications:  .  cefTRIAXone (ROCEPHIN) 2 g in sodium chloride 0.9 % 100 mL IVPB, 2 g, Intravenous, Q24H, Martina Sinner, MD, Stopped at 03/07/20 1922 .  Chlorhexidine Gluconate Cloth 2 % PADS 6 each, 6 each, Topical, Daily, Martina Sinner, MD, 6 each at 03/08/20 [phone] .  docusate sodium (COLACE) capsule 100 mg, 100 mg, Oral, BID PRN, Bowser, Grace E, NP .  heparin injection 5,000 Units, 5,000 Units, Subcutaneous, Q8H, Bowser, Kaylyn Layer, NP, 5,000 Units at 03/08/20 0603 .  MEDLINE mouth rinse, 15 mL, Mouth Rinse, BID, Martina Sinner, MD, 15 mL at 03/07/20 2302 .  metroNIDAZOLE (FLAGYL) IVPB 500 mg, 500 mg, Intravenous, Q8H, Martina Sinner, MD, Stopped at 03/08/20 [phone] .  morphine 2 MG/ML injection 2 mg, 2 mg, Intravenous, Q4H PRN, Martina Sinner, MD, 2 mg at 03/08/20 0744 .  norepinephrine (LEVOPHED) 4mg  in premix infusion, 0-40 mcg/min, Intravenous, Titrated, B, MD, Last Rate: 7.5 mL/hr at 03/08/20 0900, 2 mcg/min at 03/08/20 0900 .  ondansetron (ZOFRAN)  injection 4 mg, 4 mg, Intravenous, Q6H PRN, Bowser, Grace E, NP .  polyethylene glycol (MIRALAX / GLYCOLAX) packet 17 g, 17 g, Oral, Daily PRN, Bowser, Kaylyn Layer, NP No Known Allergies   Objective:     BP 102/65   Pulse (!) 43   Temp 98.1 F (36.7 C) (Oral)   Resp (!) 28   Ht 6' (1.829 m)   Wt 88.9 kg   SpO2 95%   BMI 26.58 kg/m   No distress  Heart regular rhythm  Lungs clear  Abdomen soft with tenderness in the epigastrium no masses felt no hepatosplenomegaly  Laboratory No components found for: D1    Assessment:     Pancreatic lesion uncertain etiology      Plan:     Proceed with MRCP as the next step to further  evaluate and then we can decide on ERCP.

## 2020-03-08 NOTE — Progress Notes (Signed)
  Amiodarone Drug - Drug Interaction Consult Note  Recommendations: -No DDIs noted  Amiodarone is metabolized by the cytochrome P450 system and therefore has the potential to cause many drug interactions. Amiodarone has an average plasma half-life of 50 days (range 20 to 100 days).   There is potential for drug interactions to occur several weeks or months after stopping treatment and the onset of drug interactions may be slow after initiating amiodarone.   []  Statins: Increased risk of myopathy. Simvastatin- restrict dose to 20mg  daily. Other statins: counsel patients to report any muscle pain or weakness immediately.  []  Anticoagulants: Amiodarone can increase anticoagulant effect. Consider warfarin dose reduction. Patients should be monitored closely and the dose of anticoagulant altered accordingly, remembering that amiodarone levels take several weeks to stabilize.  []  Antiepileptics: Amiodarone can increase plasma concentration of phenytoin, the dose should be reduced. Note that small changes in phenytoin dose can result in large changes in levels. Monitor patient and counsel on signs of toxicity.  []  Beta blockers: increased risk of bradycardia, AV block and myocardial depression. Sotalol - avoid concomitant use.  []   Calcium channel blockers (diltiazem and verapamil): increased risk of bradycardia, AV block and myocardial depression.  []   Cyclosporine: Amiodarone increases levels of cyclosporine. Reduced dose of cyclosporine is recommended.  []  Digoxin dose should be halved when amiodarone is started.  []  Diuretics: increased risk of cardiotoxicity if hypokalemia occurs.  []  Oral hypoglycemic agents (glyburide, glipizide, glimepiride): increased risk of hypoglycemia. Patient's glucose levels should be monitored closely when initiating amiodarone therapy.   []  Drugs that prolong the QT interval:  Torsades de pointes risk may be increased with concurrent use - avoid if possible.   Monitor QTc, also keep magnesium/potassium WNL if concurrent therapy can't be avoided. Antibiotics: e.g. fluoroquinolones, erythromycin. . Antiarrhythmics: e.g. quinidine, procainamide, disopyramide, sotalol. . Antipsychotics: e.g. phenothiazines, haloperidol.  . Lithium, tricyclic antidepressants, and methadone. Thank  03/08/2020 4:44 PM

## 2020-03-09 ENCOUNTER — Inpatient Hospital Stay (HOSPITAL_COMMUNITY): Payer: Self-pay

## 2020-03-09 DIAGNOSIS — N179 Acute kidney failure, unspecified: Secondary | ICD-10-CM

## 2020-03-09 DIAGNOSIS — R739 Hyperglycemia, unspecified: Secondary | ICD-10-CM

## 2020-03-09 DIAGNOSIS — R7989 Other specified abnormal findings of blood chemistry: Secondary | ICD-10-CM

## 2020-03-09 DIAGNOSIS — E876 Hypokalemia: Secondary | ICD-10-CM

## 2020-03-09 DIAGNOSIS — R188 Other ascites: Secondary | ICD-10-CM

## 2020-03-09 LAB — PROTIME-INR
INR: 1.1 (ref 0.8–1.2)
Prothrombin Time: 14.2 seconds (ref 11.4–15.2)

## 2020-03-09 LAB — HEPATIC FUNCTION PANEL
ALT: 146 U/L — ABNORMAL HIGH (ref 0–44)
AST: 60 U/L — ABNORMAL HIGH (ref 15–41)
Albumin: 2 g/dL — ABNORMAL LOW (ref 3.5–5.0)
Alkaline Phosphatase: 83 U/L (ref 38–126)
Bilirubin, Direct: 0.7 mg/dL — ABNORMAL HIGH (ref 0.0–0.2)
Indirect Bilirubin: 1 mg/dL — ABNORMAL HIGH (ref 0.3–0.9)
Total Bilirubin: 1.7 mg/dL — ABNORMAL HIGH (ref 0.3–1.2)
Total Protein: 5.4 g/dL — ABNORMAL LOW (ref 6.5–8.1)

## 2020-03-09 LAB — MAGNESIUM: Magnesium: 1.8 mg/dL (ref 1.7–2.4)

## 2020-03-09 MED ORDER — ALBUMIN HUMAN 25 % IV SOLN
25.0000 g | Freq: Once | INTRAVENOUS | Status: AC
  Administered 2020-03-09: 25 g via INTRAVENOUS
  Filled 2020-03-09: qty 100

## 2020-03-09 MED ORDER — FENTANYL CITRATE (PF) 100 MCG/2ML IJ SOLN
INTRAMUSCULAR | Status: AC | PRN
  Administered 2020-03-09 (×2): 25 ug via INTRAVENOUS

## 2020-03-09 MED ORDER — LIDOCAINE HCL (PF) 1 % IJ SOLN
INTRAMUSCULAR | Status: AC | PRN
  Administered 2020-03-09: 20 mL

## 2020-03-09 MED ORDER — MIDAZOLAM HCL 2 MG/2ML IJ SOLN
INTRAMUSCULAR | Status: AC
  Filled 2020-03-09: qty 2

## 2020-03-09 MED ORDER — POTASSIUM CHLORIDE 20 MEQ PO PACK
40.0000 meq | PACK | ORAL | Status: AC
  Administered 2020-03-09: 40 meq via ORAL
  Filled 2020-03-09: qty 2

## 2020-03-09 MED ORDER — SODIUM CHLORIDE 0.9% FLUSH
5.0000 mL | Freq: Three times a day (TID) | INTRAVENOUS | Status: DC
  Administered 2020-03-10 – 2020-03-15 (×9): 5 mL

## 2020-03-09 MED ORDER — MIDAZOLAM HCL 2 MG/2ML IJ SOLN
INTRAMUSCULAR | Status: AC | PRN
  Administered 2020-03-09 (×2): 0.5 mg via INTRAVENOUS

## 2020-03-09 MED ORDER — FENTANYL CITRATE (PF) 100 MCG/2ML IJ SOLN
INTRAMUSCULAR | Status: AC
  Filled 2020-03-09: qty 2

## 2020-03-09 MED ORDER — LACTATED RINGERS IV BOLUS
500.0000 mL | Freq: Once | INTRAVENOUS | Status: AC
  Administered 2020-03-09: 500 mL via INTRAVENOUS

## 2020-03-09 MED ORDER — CHLORHEXIDINE GLUCONATE 0.12 % MT SOLN
OROMUCOSAL | Status: AC
  Filled 2020-03-09: qty 15

## 2020-03-09 MED ORDER — MAGNESIUM SULFATE 2 GM/50ML IV SOLN
2.0000 g | Freq: Once | INTRAVENOUS | Status: AC
  Administered 2020-03-09: 2 g via INTRAVENOUS
  Filled 2020-03-09: qty 50

## 2020-03-09 NOTE — Progress Notes (Signed)
Attempting to bring pt down for drain placement. Per nurse she is unable to accompany pt down, will attempt to [facility] nurse.

## 2020-03-09 NOTE — Progress Notes (Signed)
NAME:  Brandon Spence, MRN:  [id number], DOB:  18-Jan-1951, LOS: 2 ADMISSION DATE:  03/07/2020, CONSULTATION DATE:  03/07/20 REFERRING MD: Rogene Houston - EM , CHIEF COMPLAINT:  Weakness, dehydration, recent prolonged sun exposure   Brief History   69 yo presented to ED after feeling ill x several days following prolonged sun exposure. Sepsis criteria in ED, started on abx. Jaundice -- CT a/p with biliary duct dilatation, pancreatic lesion Remains somewhat hypotensive PCCM for admit  History of present illness   Brandon Spence is a 69 year old male without known PMH who presents to ED 8/15 with dark urine following progressive feelings of general unwellness, weakness, fatigue x 3 days after prolonged sun exposure. He also reported severe cramping abdominal pain that started on 8/14. The pain was diffuse and intermittent.   In ED, met sepsis criteria and was resuscitated with 30cc/kg, started on empiric abx. Initial labs reveal myriad of metabolic derrangements including Na 123 K 3.3 Co2 17 Cr 1.98 AG 24 Alk phos 160 AST 275 ALT 434 Tbili 6.8. On exam he was jaundiced and sent for CT abdomen pelvis which revealed intra and extrahepatic biliary ductal dilation, CBD measuring 33m. There is a hypoecoic lesion in the pancreatic head measuring 2.8 x 1.2cm.   Patient is complaining of fatigue, belching and some abdominal pain. Blood pressure remained 80/60 despite fluid resuscitation.   Past Medical History  [facility]   8/15 presents to ED, volume resuscitated and started on abx for possible sepsis, admitting to ICU   Consults:  GI   Procedures:    Significant Diagnostic Tests:  8/15 CT a/p noncon> intra and extrahepatic biliary ductal dilatation, common bile duct 23mwithout definite obstructing lesion. Pancreatic head with hypoechoic lesion 2.8cm x 1.2 cm (mass vs dilated portion of main pancreatic duct?) Moderate amount of fluid between stomach and liver.   Micro  Data:  8/15 SARS Cov2 neg 8/15 BCx> 8/15 UA>  Antimicrobials:  8/15 vanc 8/15 cefepime  8/15 Flagyl>> 8/15 ceftriaxone>> Interim history/subjective:  BP stable, soft off pressors, Bolus this AM, to go to IR for drain placement of presumed abdominal abscess Objective   Blood pressure 98/65, pulse (!) 35, temperature 98 F (36.7 C), temperature source Axillary, resp. rate 20, height 6' (1.829 m), weight 89.8 kg, SpO2 96 %.        Intake/Output Summary (Last 24 hours) at 03/09/2020 [id number] data filed at 03/09/2020 0600 Gross per 24 hour  Intake 3629.56 ml  Output 1050 ml  Net 2579.56 ml   Filed Weights   03/07/20 1045 03/08/20 0341 03/09/20 0500  Weight: 81.6 kg 88.9 kg 89.8 kg    Examination: General: Alert, in NAD Lungs: Clear to auscultation bilaterally Cardiovascular: RRR, S1-S2 appreciated Abdomen: Round, soft, non tender Extremities: Symmetrical bulk and tone no obvious joint deformity no cyanosis or clubbing no edema Neuro: Alert and interactive, follows commands   Resolved Hospital Problem list     Assessment & Plan:   Shock Hypovolemic shock in setting of dehydration (prolonged sun exposure ?sun poisoning), likely component of septic shock from biliary/abdominal source -Fluid resuscitated at 30 cc/kg -Albumin and LR bolus this AM in anticipation of procedure -MIVF to end evening 8/17 -Follow cx data -Continue Flagyl and ceftriaxone  Abdominal fluid collection: Likely source of septic shock. --abx as above --IR to drain 8/17  Biliary ductal dilatation In setting of pancreatic lesion leading to hyperbilirubinemia, jaundiced appearence and elevated liver enzymes -  Gastroenterology consulted, appreciate GI input, likely will need ERCP in future -trend LFTs, Tbili, coags - improving - Pain control IV opiates  Anion Gap Metabolic Acidosis - Improving In setting of shock, sepsis and lactic acidosis  - IVF and antibiotics as above  Acute Kidney Injury-  Improving with fluids Likely pre-renal given volume status and in setting of shock - IVF as above  Hyponatremia, hypovolemic - Fluid resuscitation as above  Hypokalemia -replete potassium as needed -Check Mag level  Hyperglycemia: No known h/o DM --Hgb a1c ordered --AM glucoses  Best practice:  Diet: Sips with meds, ice chips  Pain/Anxiety/Delirium protocol (if indicated): PRN VAP protocol (if indicated):na DVT prophylaxis: SQ heparin GI prophylaxis: protonix  Glucose control: monitor Mobility: BR Code Status: DNR/DNI, will reverse code status if needed for procedures. Family Communication: daily updates as able Disposition: admit to ICU   Labs   CBC: Recent Labs  Lab 03/07/20 1051 03/07/20 1637 03/07/20 1905 03/08/20 0055  WBC 8.2  --  13.8* 24.2*  NEUTROABS 7.5  --   --   --   HGB 14.6 12.9* 12.3* 11.4*  HCT 44.0 38.0* 35.2* 32.2*  MCV 90.7  --  89.1 89.0  PLT 287  --  146* 129*    Basic Metabolic Panel: Recent Labs  Lab 03/07/20 1051 03/07/20 1637 03/07/20 1905 03/07/20 2256 03/08/20 0055 03/08/20 1930  NA 123* 125*  --  126* 130*  --   K 3.3* 2.9*  --  3.0* 3.1*  --   CL 82* 91*  --  91* 95*  --   CO2 17*  --   --  21* 22  --   GLUCOSE 167* 91  --  111* 112*  --   BUN 43* 40*  --  43* 40*  --   CREATININE 1.98* 1.70* 1.87* 1.67* 1.63*  --   CALCIUM 8.3*  --   --  7.4* 7.4*  --   MG  --   --   --   --   --  1.6*   GFR: Estimated Creatinine Clearance: 48.3 mL/min (A) (by C-G formula based on SCr of 1.63 mg/dL (H)). Recent Labs  Lab 03/07/20 1051 03/07/20 1322 03/07/20 1905 03/07/20 2256 03/08/20 0055  WBC 8.2  --  13.8*  --  24.2*  LATICACIDVEN 8.5* 5.0* 3.4* 3.0*  --     Liver Function Tests: Recent Labs  Lab 03/07/20 1051 03/07/20 2256 03/09/20 0048  AST 275* 137* 60*  ALT 434* 231* 146*  ALKPHOS 160* 112 83  BILITOT 6.8* 3.1* 1.7*  PROT 7.4 5.2* 5.4*  ALBUMIN 3.3* 2.2* 2.0*   No results for input(s): LIPASE, AMYLASE in the  last 168 hours. No results for input(s): AMMONIA in the last 168 hours.  ABG    Component Value Date/Time   TCO2 15 (L) 03/07/2020 1637     Coagulation Profile: Recent Labs  Lab 03/07/20 2256 03/08/20 0055 03/09/20 0048  INR 1.6* 1.7* 1.1    Cardiac Enzymes: Recent Labs  Lab 03/07/20 1247 03/08/20 0055  CKTOTAL 528* 464*    HbA1C: No results found for: HGBA1C  CBG: Recent Labs  Lab 03/07/20 2220  GLUCAP 98     Past Medical History  He,  has no past medical history on file.   Surgical History   History reviewed. No pertinent surgical history.   Social History   does not have a smoking history on file. He has never used smokeless tobacco. He reports current  alcohol use. He reports previous drug use.   Family History   His family history is not on file.   Allergies No Known Allergies   Home Medications  Prior to Admission medications   Not on File     CRITICAL CARE Performed by: Lanier Clam   Total critical care time: 35 minutes  Critical care time was exclusive of separately billable procedures and treating other patients.  Critical care was necessary to treat or prevent imminent or life-threatening deterioration.  Critical care was time spent personally by me on the following activities: development of treatment plan with patient and/or surrogate as well as nursing, discussions with consultants, evaluation of patient's response to treatment, examination of patient, obtaining history from patient or surrogate, ordering and performing treatments and interventions, ordering and review of laboratory studies, ordering and review of radiographic studies, pulse oximetry, re-evaluation of patient's condition and participation in multidisciplinary rounds.  03/09/2020, 9:31 AM   Larey Days MD [facility] Personal pager: [phone] If unanswered, please page CCM On-call: [phone]

## 2020-03-09 NOTE — Progress Notes (Signed)
Patient seen.  No new complaints.  Waiting on interventional radiology to aspirate abdominal fluid collection to see what this is.  I discussed ERCP and sphincterotomy and common bile duct stone extraction with patient.  I explained the risks of bleeding, infection, perforation, and pancreatitis.  He understands and is agreeable.  We will await Dr. Karki's decision on when this can be done. 

## 2020-03-09 NOTE — Progress Notes (Signed)
Pharmacy Electrolyte Replacement  Recent Labs: Magnesium 1.8  Recent Labs    03/08/20 0055 03/08/20 1930 03/09/20 0826  K 3.1*  --   --   MG  --    < > 1.8  CREATININE 1.63*  --   --    < > = values in this interval not displayed.    Plan: Magnesium 2g IV x1  Calton Dach, PharmD PGY1 Pharmacy Resident 03/09/2020 1:33 PM

## 2020-03-09 NOTE — Consult Note (Signed)
Chief Complaint: Left upper cavity peritoneal fluid collection. Request is for abscess drain placement.  Referring Physician(s): Dr. Burnard Bunting  Supervising Physician: Gilmer Mor  Patient Status: [facility] - In-pt  History of Present Illness: Brandon Spence is a 69 y.o. male Unknown history. Presented to the ED with general weakness and fatigue X 3 days with abdominal pain and dark urine. Patient was found to septic. CT abd pelvis from 8.15 shows biliary dilation. MRCP from 8.16.21 reads there is a lobulated loculated 7.2 x 2.8 x 12.0 cm fluid collection in the anterior left upper peritoneal cavity just to the left of midline scalloping the anterior liver contour in the lateral segment left liver lobe, demonstrating mild wall thickening and enhancement. No additional fluid collections or ascites  Team is requesting abscess drain placement tot he left upper peritoneal fluid cavity for possible infection source.     History reviewed. No pertinent past medical history.  History reviewed. No pertinent surgical history.  Allergies: Patient has no known allergies.  Medications: Prior to Admission medications   Not on File     No family history on file.  Social History   Socioeconomic History  . Marital status: Single    Spouse name: Not on file  . Number of children: Not on file  . Years of education: Not on file  . Highest education level: Not on file  Occupational History  . Not on file  Tobacco Use  . Smoking status: Not on file  . Smokeless tobacco: Never Used  Substance and Sexual Activity  . Alcohol use: Yes  . Drug use: Not Currently  . Sexual activity: Not on file  Other Topics Concern  . Not on file  Social History Narrative  . Not on file   Social Determinants of Health   Financial Resource Strain:   . Difficulty of Paying Living Expenses:   Food Insecurity:   . Worried About Programme researcher, broadcasting/film/video in the Last Year:   . Barista in the Last Year:     Transportation Needs:   . Freight forwarder (Medical):   Marland Kitchen Lack of Transportation (Non-Medical):   Physical Activity:   . Days of Exercise per Week:   . Minutes of Exercise per Session:   Stress:   . Feeling of Stress :   Social Connections:   . Frequency of Communication with Friends and Family:   . Frequency of Social Gatherings with Friends and Family:   . Attends Religious Services:   . Active Member of Clubs or Organizations:   . Attends Banker Meetings:   Marland Kitchen Marital Status:      Review of Systems: A 12 point ROS discussed and pertinent positives are indicated in the HPI above.  All other systems are negative.  Review of Systems  Constitutional: Negative for fever.  HENT: Negative for congestion.   Respiratory: Negative for cough and shortness of breath.   Cardiovascular: Negative for chest pain.  Gastrointestinal: Negative for abdominal pain ( describes as cramping and stabbing feeling. ).  Neurological: Negative for headaches.  Psychiatric/Behavioral: Negative for behavioral problems and confusion.    Vital Signs: BP 98/65   Pulse (!) 35   Temp 98 F (36.7 C) (Axillary)   Resp 20   Ht 6' (1.829 m)   Wt 197 lb 15.6 oz (89.8 kg)   SpO2 96%   BMI 26.85 kg/m   Physical Exam Vitals and nursing note reviewed.  Constitutional:  Appearance: He is well-developed.  HENT:     Head: Normocephalic.  Cardiovascular:     Rate and Rhythm: Normal rate and regular rhythm.     Heart sounds: Normal heart sounds.  Pulmonary:     Effort: Pulmonary effort is normal.     Breath sounds: Normal breath sounds.  Musculoskeletal:        General: Normal range of motion.     Cervical back: Normal range of motion.  Skin:    General: Skin is dry.  Neurological:     Mental Status: He is alert and oriented to person, place, and time.     Imaging: CT Abdomen Pelvis Wo Contrast  Result Date: 03/07/2020 CLINICAL DATA:  Dark urine. EXAM: CT ABDOMEN AND  PELVIS WITHOUT CONTRAST TECHNIQUE: Multidetector CT imaging of the abdomen and pelvis was performed following the standard protocol without IV contrast. COMPARISON:  Same day chest radiograph. FINDINGS: Lower chest: Moderate bibasilar atelectasis is noted. Hepatobiliary: No focal liver abnormality is seen. There is intra and extrahepatic biliary ductal dilatation with the common bile duct measuring up to 21 mm in diameter. No definite obstructing lesion is identified. No gallstones or gallbladder wall thickening. Pancreas: A hypoechoic lesion in the pancreatic head measures 2.8 x 1.2 cm and may represent a mass in the pancreatic head or a dilated portion of the main pancreatic duct (series 6, image 36). The pancreatic duct within the body and tail appears normal given technique. No inflammatory changes are noted surrounding the pancreas. Spleen: Normal in size without focal abnormality. Adrenals/Urinary Tract: Adrenal glands are unremarkable. Kidneys are normal, without renal calculi, focal lesion, or hydronephrosis. Bladder is unremarkable. Stomach/Bowel: Stomach is within normal limits. Appendix appears normal. No evidence of bowel wall thickening, distention, or inflammatory changes. Vascular/Lymphatic: Aortic atherosclerosis. No enlarged abdominal or pelvic lymph nodes. Reproductive: Prostate is unremarkable. Other: No abdominal wall hernia or abnormality. A moderate amount of fluid is seen between the stomach and the liver (series 3, image 21) and inferior to the stomach (series 3, image 35), likely located within the omentum. Musculoskeletal: Degenerative changes are seen in the spine. IMPRESSION: 1. Intra and extrahepatic biliary ductal dilatation with the common bile duct measuring up to 21 mm in diameter. No definite obstructing lesion is identified. 2. A hypoechoic lesion in the pancreatic head measures 2.8 x 1.2 cm and may represent a mass in the pancreatic head or a dilated portion of the main pancreatic  duct. This could be further evaluated with MRCP. 3. A moderate amount of fluid between the stomach and the liver is likely located within the omentum and is nonspecific. Aortic Atherosclerosis (ICD10-I70.0). Electronically Signed   By: Romona Curls M.D.   On: 03/07/2020 14:36   DG Chest 2 View  Result Date: 03/07/2020 CLINICAL DATA:  Patient with heat stroke.  Headache. EXAM: CHEST - 2 VIEW COMPARISON:  None. FINDINGS: Normal cardiac and mediastinal contours. Low lung volumes. Bibasilar heterogeneous opacities. No pleural effusion or pneumothorax. Thoracic spine degenerative changes. IMPRESSION: Low lung volumes with bibasilar atelectasis. Electronically Signed   By: Annia Belt M.D.   On: 03/07/2020 12:19   MR ABDOMEN MRCP W WO CONTAST  Result Date: 03/08/2020 CLINICAL DATA:  Inpatient. Jaundice. Fatigue. Abdominal pain. Dark urine. Sepsis. Biliary dilatation on noncontrast CT from 1 day prior. EXAM: MRI ABDOMEN WITHOUT AND WITH CONTRAST (INCLUDING MRCP) TECHNIQUE: Multiplanar multisequence MR imaging of the abdomen was performed both before and after the administration of intravenous contrast. Heavily T2-weighted images of  the biliary and pancreatic ducts were obtained, and three-dimensional MRCP images were rendered by post processing. 3D MRCP sequence could not be performed due to tachypnea and erratic hiccups. CONTRAST:  8.225mL GADAVIST GADOBUTROL 1 MMOL/ML IV SOLN COMPARISON:  03/07/2020 unenhanced CT abdomen/pelvis. FINDINGS: Significantly motion degraded scan, limiting assessment. Lower chest: Prominent atelectasis at the dependent lung bases bilaterally. Hepatobiliary: Normal liver size and configuration. No hepatic steatosis. No liver mass. Nondistended gallbladder contains multiple layering gallstones up to 1.0 cm in diameter. Mild diffuse gallbladder wall thickening. No significant pericholecystic fluid. Diffuse mild-to-moderate intrahepatic biliary ductal dilatation with diffuse periportal  edema. Dilated common bile duct (14 mm diameter). There is a suggestion of a 5 mm choledocholith in the lower third of the CBD (series 12/image 20). No discrete enhancing biliary mass. No bile duct beading. Pancreas: No discrete pancreatic mass. Mild dilatation of ventral pancreatic duct in the pancreatic head (4-5 mm diameter) with normal caliber main pancreatic duct. No pancreas divisum. Spleen: Normal size. No mass. Adrenals/Urinary Tract: Normal adrenals. No hydronephrosis. Mild renal cortical scarring in the lower left kidney. No renal masses. Stomach/Bowel: Normal non-distended stomach. Visualized small and large bowel is normal caliber, with no bowel wall thickening. Vascular/Lymphatic: Normal caliber abdominal aorta. Patent portal, splenic, hepatic and renal veins. Mildly enlarged porta hepatis nodes up to 1.6 cm (series 1302/image 69). No additional pathologically enlarged lymph nodes in the abdomen. Other: There is a lobulated loculated 7.2 x 2.8 x 12.0 cm fluid collection in the anterior left upper peritoneal cavity just to the left of midline scalloping the anterior liver contour in the lateral segment left liver lobe, demonstrating mild wall thickening and enhancement. No additional fluid collections or ascites. Musculoskeletal: No aggressive appearing focal osseous lesions. IMPRESSION: 1. Limited motion degraded scan. 2. Cholelithiasis. Mild diffuse gallbladder wall thickening. No significant pericholecystic fluid. No gallbladder distention. 3. Diffuse mild-to-moderate intrahepatic biliary ductal dilatation with diffuse periportal edema. Dilated common bile duct (14 mm diameter) with suggestion of a 5 mm choledocholith in the lower third of the CBD. 4. Mild dilatation of the ventral pancreatic duct in the pancreatic head. No discrete pancreatic mass. 5. Lobulated loculated 7.2 x 2.8 x 12.0 cm fluid collection in the anterior left upper peritoneal cavity with mild wall thickening and enhancement,  scalloping the anterior liver contour in the lateral segment left liver lobe, cannot exclude an infected collection. 6. Mild porta hepatis lymphadenopathy, nonspecific. 7. Prominent atelectasis at the dependent lung bases bilaterally. Electronically Signed   By: Delbert PhenixJason A Poff M.D.   On: 03/08/2020 15:23    Labs:  CBC: Recent Labs    03/07/20 1051 03/07/20 1637 03/07/20 1905 03/08/20 0055  WBC 8.2  --  13.8* 24.2*  HGB 14.6 12.9* 12.3* 11.4*  HCT 44.0 38.0* 35.2* 32.2*  PLT 287  --  146* 129*    COAGS: Recent Labs    03/07/20 2256 03/08/20 0055 03/09/20 0048  INR 1.6* 1.7* 1.1  APTT 37*  --   --     BMP: Recent Labs    03/07/20 1051 03/07/20 1051 03/07/20 1637 03/07/20 1905 03/07/20 2256 03/08/20 0055  NA 123*  --  125*  --  126* 130*  K 3.3*  --  2.9*  --  3.0* 3.1*  CL 82*  --  91*  --  91* 95*  CO2 17*  --   --   --  21* 22  GLUCOSE 167*  --  91  --  111* 112*  BUN 43*  --  40*  --  43* 40*  CALCIUM 8.3*  --   --   --  7.4* 7.4*  CREATININE 1.98*   < > 1.70* 1.87* 1.67* 1.63*  GFRNONAA 34*  --   --  36* 42* 43*  GFRAA 39*  --   --  42* 48* 50*   < > = values in this interval not displayed.    LIVER FUNCTION TESTS: Recent Labs    03/07/20 1051 03/07/20 2256 03/09/20 0048  BILITOT 6.8* 3.1* 1.7*  AST 275* 137* 60*  ALT 434* 231* 146*  ALKPHOS 160* 112 83  PROT 7.4 5.2* 5.4*  ALBUMIN 3.3* 2.2* 2.0*    Assessment and Plan:  69 y.o, male inpatient. Unknown history. Presented to the ED with general weakness and fatigue X 3 days with abdominal pain and dark urine. Patient was found to septic. CT abd pelvis from 8.15 shows biliary dilation. MRCP from 8.16.21 reads there is a lobulated loculated 7.2 x 2.8 x 12.0 cm fluid collection in the anterior left upper peritoneal cavity just to the left of midline scalloping the anterior liver contour in the lateral segment left liver lobe, demonstrating mild wall thickening and enhancement. No additional fluid  collections or ascites  Team is requesting abscess drain placement tot he left upper peritoneal fluid cavity for possible infection source.   Patient is being followed by GI who is planning an ERCP to be followed by IR drain placement. Patient has no known drug allergies. Na 130, BUN 40, Cr 1.63, AST 60, ALT 146, Total bilirubin 1.0, WBC is 24.2. Lactic acid from 8.15.21 is 3.0 (downtrending)  All other labs are within acceptable parameters. Patient is on subcutaneous prophylactic dose of heparin. Last dose given on 8.16.21 @ 15:15. Patient is afebrile.  IR consulted for possible peritoneal cavity abscess drain. Case has been reviewed and procedure approved by Dr. Archer Asa.  Patient tentatively scheduled for 8.17.21.  Team instructed to: Keep Patient to be NPO after midnight Hold prophylactic anticoagulation 24 hours prior to scheduled procedure.  IR will call patient when ready.  Risks and benefits discussed with the patient including bleeding, infection, damage to adjacent structures, bowel perforation/fistula connection, and sepsis.  All of the patient's questions were answered, patient is agreeable to proceed.  Consent signed and in chart.    Thank you for this interesting consult.  I greatly enjoyed meeting DEBRA CALABRETTA and look forward to participating in their care.  A copy of this report was sent to the requesting provider on this date.  Electronically Signed: Alene Mires, NP 03/09/2020, 7:54 AM   I spent a total of 40 Minutes    in face to face in clinical consultation, greater than 50% of which was counseling/coordinating care for left upper cavity peritoneal abscess drain placement.

## 2020-03-09 NOTE — Procedures (Signed)
Interventional Radiology Procedure Note  Procedure: Image guided drain placement, left abdomen fluid.  73F drain.  Appears to be biloma  Complications: None  EBL: None Sample: Culture sent, and total bilirubin/body fluid  Recommendations: - Routine drain care, with sterile flushes, record output - follow up Cx, labs - routine wound care  Signed,  Yvone Neu. Loreta Ave, DO

## 2020-03-09 NOTE — H&P (View-Only) (Signed)
Patient seen.  No new complaints.  Waiting on interventional radiology to aspirate abdominal fluid collection to see what this is.  I discussed ERCP and sphincterotomy and common bile duct stone extraction with patient.  I explained the risks of bleeding, infection, perforation, and pancreatitis.  He understands and is agreeable.  We will await Dr. Laurey Morale decision on when this can be done.

## 2020-03-10 ENCOUNTER — Inpatient Hospital Stay (HOSPITAL_COMMUNITY): Payer: Self-pay | Admitting: Certified Registered Nurse Anesthetist

## 2020-03-10 ENCOUNTER — Encounter (HOSPITAL_COMMUNITY)
Admission: EM | Disposition: A | Discharge status: Home / Self Care | Payer: Self-pay | Source: Home / Self Care | Attending: Internal Medicine

## 2020-03-10 ENCOUNTER — Inpatient Hospital Stay (HOSPITAL_COMMUNITY): Payer: Self-pay

## 2020-03-10 ENCOUNTER — Other Ambulatory Visit: Payer: Self-pay

## 2020-03-10 ENCOUNTER — Encounter (HOSPITAL_COMMUNITY): Payer: Self-pay | Admitting: Pulmonary Disease

## 2020-03-10 DIAGNOSIS — K651 Peritoneal abscess: Secondary | ICD-10-CM

## 2020-03-10 DIAGNOSIS — K838 Other specified diseases of biliary tract: Secondary | ICD-10-CM

## 2020-03-10 HISTORY — PX: ERCP: SHX5425

## 2020-03-10 HISTORY — PX: SPHINCTEROTOMY: SHX5544

## 2020-03-10 LAB — COMPREHENSIVE METABOLIC PANEL
ALT: 93 U/L — ABNORMAL HIGH (ref 0–44)
AST: 30 U/L (ref 15–41)
Albumin: 2.1 g/dL — ABNORMAL LOW (ref 3.5–5.0)
Alkaline Phosphatase: 78 U/L (ref 38–126)
Anion gap: 8 (ref 5–15)
BUN: 13 mg/dL (ref 8–23)
CO2: 25 mmol/L (ref 22–32)
Calcium: 7.6 mg/dL — ABNORMAL LOW (ref 8.9–10.3)
Chloride: 99 mmol/L (ref 98–111)
Creatinine, Ser: 0.78 mg/dL (ref 0.61–1.24)
GFR calc Af Amer: >60 mL/min (ref >60)
GFR calc non Af Amer: >60 mL/min (ref >60)
Glucose, Bld: 130 mg/dL — ABNORMAL HIGH (ref 70–99)
Potassium: 3.3 mmol/L — ABNORMAL LOW (ref 3.5–5.1)
Sodium: 132 mmol/L — ABNORMAL LOW (ref 135–145)
Total Bilirubin: 1.5 mg/dL — ABNORMAL HIGH (ref 0.3–1.2)
Total Protein: 5.3 g/dL — ABNORMAL LOW (ref 6.5–8.1)

## 2020-03-10 LAB — CBC
HCT: 30 % — ABNORMAL LOW (ref 39.0–52.0)
Hemoglobin: 10.4 g/dL — ABNORMAL LOW (ref 13.0–17.0)
MCH: 30.9 pg (ref 26.0–34.0)
MCHC: 34.7 g/dL (ref 30.0–36.0)
MCV: 89 fL (ref 80.0–100.0)
Platelets: 123 10*3/uL — ABNORMAL LOW (ref 150–400)
RBC: 3.37 MIL/uL — ABNORMAL LOW (ref 4.22–5.81)
RDW: 13.3 % (ref 11.5–15.5)
WBC: 10.2 10*3/uL (ref 4.0–10.5)
nRBC: 0 % (ref 0.0–0.2)

## 2020-03-10 LAB — HEMOGLOBIN A1C
Hgb A1c MFr Bld: 5.8 % — ABNORMAL HIGH (ref 4.8–5.6)
Mean Plasma Glucose: 119.76 mg/dL

## 2020-03-10 SURGERY — ERCP, WITH INTERVENTION IF INDICATED
Anesthesia: General

## 2020-03-10 MED ORDER — PROPOFOL 1000 MG/100ML IV EMUL
INTRAVENOUS | Status: AC
  Filled 2020-03-10: qty 100

## 2020-03-10 MED ORDER — SUCCINYLCHOLINE CHLORIDE 20 MG/ML IJ SOLN
INTRAMUSCULAR | Status: DC | PRN
  Administered 2020-03-10: 100 mg via INTRAVENOUS

## 2020-03-10 MED ORDER — SODIUM CHLORIDE 0.9 % IV SOLN
INTRAVENOUS | Status: DC | PRN
  Administered 2020-03-10: 20 mL

## 2020-03-10 MED ORDER — ENOXAPARIN SODIUM 40 MG/0.4ML ~~LOC~~ SOLN
40.0000 mg | SUBCUTANEOUS | Status: DC
  Administered 2020-03-11 – 2020-03-14 (×4): 40 mg via SUBCUTANEOUS
  Filled 2020-03-10 (×5): qty 0.4

## 2020-03-10 MED ORDER — EPHEDRINE SULFATE-NACL 50-0.9 MG/10ML-% IV SOSY
PREFILLED_SYRINGE | INTRAVENOUS | Status: DC | PRN
  Administered 2020-03-10: 10 mg via INTRAVENOUS
  Administered 2020-03-10: 5 mg via INTRAVENOUS

## 2020-03-10 MED ORDER — INDOMETHACIN 50 MG RE SUPP
RECTAL | Status: AC
  Filled 2020-03-10: qty 1

## 2020-03-10 MED ORDER — LIDOCAINE HCL (CARDIAC) PF 100 MG/5ML IV SOSY
PREFILLED_SYRINGE | INTRAVENOUS | Status: DC | PRN
  Administered 2020-03-10: 80 mg via INTRAVENOUS

## 2020-03-10 MED ORDER — ROCURONIUM BROMIDE 10 MG/ML (PF) SYRINGE
PREFILLED_SYRINGE | INTRAVENOUS | Status: DC | PRN
  Administered 2020-03-10: 30 mg via INTRAVENOUS

## 2020-03-10 MED ORDER — PROPOFOL 500 MG/50ML IV EMUL
INTRAVENOUS | Status: AC
  Filled 2020-03-10: qty 50

## 2020-03-10 MED ORDER — FENTANYL CITRATE (PF) 100 MCG/2ML IJ SOLN
INTRAMUSCULAR | Status: DC | PRN
  Administered 2020-03-10: 100 ug via INTRAVENOUS

## 2020-03-10 MED ORDER — SODIUM CHLORIDE 0.9 % IV SOLN: INTRAVENOUS | Status: DC

## 2020-03-10 MED ORDER — LACTATED RINGERS IV SOLN: INTRAVENOUS | Status: DC

## 2020-03-10 MED ORDER — FENTANYL CITRATE (PF) 100 MCG/2ML IJ SOLN
INTRAMUSCULAR | Status: AC
  Filled 2020-03-10: qty 2

## 2020-03-10 MED ORDER — SUGAMMADEX SODIUM 200 MG/2ML IV SOLN
INTRAVENOUS | Status: DC | PRN
  Administered 2020-03-10: 200 mg via INTRAVENOUS

## 2020-03-10 MED ORDER — PHENYLEPHRINE HCL (PRESSORS) 10 MG/ML IV SOLN
INTRAVENOUS | Status: DC | PRN
  Administered 2020-03-10: 120 ug via INTRAVENOUS
  Administered 2020-03-10: 160 ug via INTRAVENOUS

## 2020-03-10 MED ORDER — GLUCAGON HCL RDNA (DIAGNOSTIC) 1 MG IJ SOLR
INTRAMUSCULAR | Status: AC
  Filled 2020-03-10: qty 1

## 2020-03-10 MED ORDER — PROPOFOL 10 MG/ML IV BOLUS
INTRAVENOUS | Status: DC | PRN
  Administered 2020-03-10: 150 mg via INTRAVENOUS

## 2020-03-10 MED ORDER — PHENYLEPHRINE HCL-NACL 10-0.9 MG/250ML-% IV SOLN
INTRAVENOUS | Status: DC | PRN
  Administered 2020-03-10: 25 ug/min via INTRAVENOUS

## 2020-03-10 MED ORDER — ONDANSETRON HCL 4 MG/2ML IJ SOLN
INTRAMUSCULAR | Status: DC | PRN
  Administered 2020-03-10: 4 mg via INTRAVENOUS

## 2020-03-10 NOTE — Op Note (Signed)
[facility] Patient Name: Brandon Spence Procedure Date : 03/10/2020 MRN: [id number] Attending MD: Kerin Salen , MD Date of Birth: 02-15-1951 CSN: [id number] Age: 69 Admit Type: Inpatient Procedure:                ERCP Indications:              Common bile duct stone noted on MRCP, large biloma                            noted, s/p IR guided drain placement, abnormal LFTs Providers:                Kerin Salen, MD, Zoe Lan, RN, Leanne Lovely,                            Technician, Crista Elliot Referring MD:             Critical Care Medicines:                Monitored Anesthesia Care Complications:            No immediate complications. Estimated Blood Loss:     Estimated blood loss: none. Procedure:                Pre-Anesthesia Assessment:                           - Prior to the procedure, a History and Physical                            was performed, and patient medications and                            allergies were reviewed. The patient's tolerance of                            previous anesthesia was also reviewed. The risks                            and benefits of the procedure and the sedation                            options and risks were discussed with the patient.                            All questions were answered, and informed consent                            was obtained. Prior Anticoagulants: The patient has                            taken heparin, last dose was 2 days prior to                            procedure. ASA Grade Assessment: III - A patient  with severe systemic disease. After reviewing the                            risks and benefits, the patient was deemed in                            satisfactory condition to undergo the procedure.                           After obtaining informed consent, the scope was                            passed under direct vision. Throughout the                             procedure, the patient's blood pressure, pulse, and                            oxygen saturations were monitored continuously. The                            [id number]-Q180V ([id number] Olympus Duodensocope was                            introduced through the mouth, and used to inject                            contrast into and used to cannulate the bile duct.                            The ERCP was accomplished without difficulty. The                            patient tolerated the procedure well. Scope In: Scope Out: Findings:      The scout film was normal. The esophagus was successfully intubated       under direct vision. The scope was advanced to a normal major papilla in       the descending duodenum without detailed examination of the pharynx,       larynx and associated structures, and upper GI tract. The upper GI tract       was grossly normal. A medium sized diverticulum was noted near the area       of ampulla.      The ampulla had a small ulcer, possibly due to passed CBD stone.      The bile duct was deeply cannulated with the sphincterotome on the first       attempt. Contrast was injected. I personally interpreted the bile duct       images. There was brisk flow of contrast through the ducts. Image       quality was excellent. Contrast extended to the main bile duct.      The main bile duct was markedly dilated. The largest diameter was 20 mm.      There was no extravasation of contrast. A straight Roadrunner wire was       passed  into the biliary tree.      A 10 mm biliary sphincterotomy was made with a braided sphincterotome       using ERBE electrocautery. There was no post-sphincterotomy bleeding.      The biliary tree was swept with a 66mm/15mm and then an 18 mm balloon       starting at the bifurcation. Nothing was found.      The pancreatic duct was never injected or canulated during the entire       procedure. Impression:               - The entire main bile  duct was markedly dilated.                           - A biliary sphincterotomy was performed.                           - The biliary tree was swept and nothing was found. Moderate Sedation:      Patient did not receive moderate sedation for this procedure, but       instead received monitored anesthesia care. Recommendation:           - Resume regular diet.                           - Refer to a Careers adviser. Patient had a bile                            collection, s/p JP drain placement ?ruptured GB. Procedure Code(s):        --- Professional ---                           [phone], Endoscopic retrograde                            cholangiopancreatography (ERCP); with                            sphincterotomy/papillotomy Diagnosis Code(s):        --- Professional ---                           K80.50, Calculus of bile duct without cholangitis                            or cholecystitis without obstruction                           K83.8, Other specified diseases of biliary tract CPT copyright 2019 [employer]. All rights reserved. The codes documented in this report are preliminary and upon coder review may  be revised to meet current compliance requirements. Kerin Salen, MD 03/10/2020 2:55:32 PM This report has been signed electronically. Number of Addenda: 0

## 2020-03-10 NOTE — Brief Op Note (Signed)
03/07/2020 - 03/10/2020  2:55 PM  PATIENT:  Brandon Spence  69 y.o. male  PRE-OPERATIVE DIAGNOSIS:  CBD stone  POST-OPERATIVE DIAGNOSIS:  sphincterotomy, balloon extraction   PROCEDURE:  Procedure(s): ENDOSCOPIC RETROGRADE CHOLANGIOPANCREATOGRAPHY (ERCP) (N/A) SPHINCTEROTOMY REMOVAL OF STONES  SURGEON:  Surgeon(s) and Role:    Ronnette Juniper, MD - Primary  PHYSICIAN ASSISTANT:   ASSISTANTS: Carlyn Reichert, RN, Tyrone Apple , Tech  ANESTHESIA:   MAC  EBL:  None  BLOOD ADMINISTERED:none  DRAINS: none   LOCAL MEDICATIONS USED:  NONE  SPECIMEN:  No Specimen  DISPOSITION OF SPECIMEN:  N/A  COUNTS:  YES  TOURNIQUET:  * No tourniquets in log *  DICTATION: .Dragon Dictation  PLAN OF CARE: Admit to inpatient   PATIENT DISPOSITION:  PACU - hemodynamically stable.   Delay start of Pharmacological VTE agent (>24hrs) due to surgical blood loss or risk of bleeding: not applicable

## 2020-03-10 NOTE — Anesthesia Postprocedure Evaluation (Signed)
Anesthesia Post Note  Patient: Brandon Spence  Procedure(s) Performed: ENDOSCOPIC RETROGRADE CHOLANGIOPANCREATOGRAPHY (ERCP) (N/A ) SPHINCTEROTOMY REMOVAL OF STONES     Patient location during evaluation: PACU Anesthesia Type: General Level of consciousness: awake and alert, oriented and patient cooperative Pain management: pain level controlled Vital Signs Assessment: post-procedure vital signs reviewed and stable Respiratory status: spontaneous breathing, nonlabored ventilation and respiratory function stable Cardiovascular status: blood pressure returned to baseline and stable Postop Assessment: no apparent nausea or vomiting Anesthetic complications: no   No complications documented.  Last Vitals:  Vitals:   03/10/20 1501 03/10/20 1512  BP: 112/69 113/69  Pulse: 71 70  Resp: (!) 28 (!) 24  Temp: 37.4 C   SpO2: 97% 95%    Last Pain:  Vitals:   03/10/20 1512  TempSrc:   PainSc: 0-No pain                 Lannie Fields

## 2020-03-10 NOTE — Progress Notes (Signed)
NAME:  Brandon Spence, MRN:  [id number], DOB:  October 30, 1950, LOS: 3 ADMISSION DATE:  03/07/2020, CONSULTATION DATE:  03/07/20 REFERRING MD: Rogene Houston - EM , CHIEF COMPLAINT:  Weakness, dehydration, recent prolonged sun exposure   Brief History   69 yo presented to ED after feeling ill x several days following prolonged sun exposure. Sepsis criteria in ED, started on abx. Jaundice -- CT a/p with biliary duct dilatation, pancreatic lesion Remains somewhat hypotensive PCCM for admit  History of present illness   Brandon Spence is a 69 year old male without known PMH who presents to ED 8/15 with dark urine following progressive feelings of general unwellness, weakness, fatigue x 3 days after prolonged sun exposure. He also reported severe cramping abdominal pain that started on 8/14. The pain was diffuse and intermittent.   In ED, met sepsis criteria and was resuscitated with 30cc/kg, started on empiric abx. Initial labs reveal myriad of metabolic derrangements including Na 123 K 3.3 Co2 17 Cr 1.98 AG 24 Alk phos 160 AST 275 ALT 434 Tbili 6.8. On exam he was jaundiced and sent for CT abdomen pelvis which revealed intra and extrahepatic biliary ductal dilation, CBD measuring 13m. There is a hypoecoic lesion in the pancreatic head measuring 2.8 x 1.2cm.   Patient is complaining of fatigue, belching and some abdominal pain. Blood pressure remained 80/60 despite fluid resuscitation.   Past Medical History  [facility]   8/15 presents to ED, volume resuscitated and started on abx for possible sepsis, admitting to ICU   Consults:  GI   Procedures:    Significant Diagnostic Tests:  8/15 CT a/p noncon> intra and extrahepatic biliary ductal dilatation, common bile duct 285mwithout definite obstructing lesion. Pancreatic head with hypoechoic lesion 2.8cm x 1.2 cm (mass vs dilated portion of main pancreatic duct?) Moderate amount of fluid between stomach and liver.   Micro  Data:  8/15 SARS Cov2 neg 8/15 BCx> 8/15 UA>  Antimicrobials:  8/15 vanc 8/15 cefepime  8/15 Flagyl>> 8/15 ceftriaxone>> Interim history/subjective:  BP stable, off pressors. VIR placed drain yesterday LUQ fluid collection. Continues abx.  Objective   Blood pressure 100/64, pulse 63, temperature 98.5 F (36.9 C), temperature source Oral, resp. rate (!) 23, height 6' (1.829 m), weight 90.1 kg, SpO2 95 %.        Intake/Output Summary (Last 24 hours) at 03/10/2020 0728 Last data filed at 03/10/2020 0600 Gross per 24 hour  Intake 1679.96 ml  Output 1205 ml  Net 474.96 ml   Filed Weights   03/08/20 0341 03/09/20 0500 03/10/20 0458  Weight: 88.9 kg 89.8 kg 90.1 kg    Examination: General: Alert, in NAD Lungs: Clear to auscultation bilaterally Cardiovascular: RRR, S1-S2 appreciated Abdomen: Round, soft, TTP LUQ and throughout w/o rebound or guarding Extremities: Symmetrical bulk and tone no obvious joint deformity no cyanosis or clubbing no edema Neuro: Alert and interactive, follows commands   Resolved Hospital Problem list     Assessment & Plan:   Shock Hypovolemic/septic shock in setting of dehydration (prolonged sun exposure ?sun poisoning) and component of septic shock from biliary/abdominal source with klebsiella bacteremia -K pneumoniae 2/2 8/14 -CTX/Flagy for bacteremia and biliary source  LUQ Abdominal fluid collection: Likely source of septic shock. --abx as above --IR drain placed 8/17  Biliary ductal dilatation In setting of pancreatic lesion leading to hyperbilirubinemia, jaundiced appearence and elevated liver enzymes - Gastroenterology consulted, appreciate GI input, likely will need ERCP in future -  trend LFTs, Tbili, coags - improving  Acute Kidney Injury- Improving with fluids Likely pre-renal given volume status and in setting of shock  Hyponatremia, hypovolemic - Fluid resuscitation as above  Hypokalemia -replete potassium as needed -Check  Mag level  Hyperglycemia: No known h/o DM --Hgb a1c elevated at 5.8 --AM glucoses  Best practice:  Diet:regular  Pain/Anxiety/Delirium protocol (if indicated): PRN VAP protocol (if indicated):na DVT prophylaxis: SQ heparin GI prophylaxis: n/a Glucose control: monitor Mobility: OOB Code Status: DNR/DNI, will reverse code status if needed for procedures. Family Communication: daily updates as able Disposition: transfer out of ICU   Labs   CBC: Recent Labs  Lab 03/07/20 1051 03/07/20 1637 03/07/20 1905 03/08/20 0055  WBC 8.2  --  13.8* 24.2*  NEUTROABS 7.5  --   --   --   HGB 14.6 12.9* 12.3* 11.4*  HCT 44.0 38.0* 35.2* 32.2*  MCV 90.7  --  89.1 89.0  PLT 287  --  146* 129*    Basic Metabolic Panel: Recent Labs  Lab 03/07/20 1051 03/07/20 1637 03/07/20 1905 03/07/20 2256 03/08/20 0055 03/08/20 1930 03/09/20 0826  NA 123* 125*  --  126* 130*  --   --   K 3.3* 2.9*  --  3.0* 3.1*  --   --   CL 82* 91*  --  91* 95*  --   --   CO2 17*  --   --  21* 22  --   --   GLUCOSE 167* 91  --  111* 112*  --   --   BUN 43* 40*  --  43* 40*  --   --   CREATININE 1.98* 1.70* 1.87* 1.67* 1.63*  --   --   CALCIUM 8.3*  --   --  7.4* 7.4*  --   --   MG  --   --   --   --   --  1.6* 1.8   GFR: Estimated Creatinine Clearance: 48.3 mL/min (A) (by C-G formula based on SCr of 1.63 mg/dL (H)). Recent Labs  Lab 03/07/20 1051 03/07/20 1322 03/07/20 1905 03/07/20 2256 03/08/20 0055  WBC 8.2  --  13.8*  --  24.2*  LATICACIDVEN 8.5* 5.0* 3.4* 3.0*  --     Liver Function Tests: Recent Labs  Lab 03/07/20 1051 03/07/20 2256 03/09/20 0048  AST 275* 137* 60*  ALT 434* 231* 146*  ALKPHOS 160* 112 83  BILITOT 6.8* 3.1* 1.7*  PROT 7.4 5.2* 5.4*  ALBUMIN 3.3* 2.2* 2.0*   No results for input(s): LIPASE, AMYLASE in the last 168 hours. No results for input(s): AMMONIA in the last 168 hours.  ABG    Component Value Date/Time   TCO2 15 (L) 03/07/2020 1637     Coagulation  Profile: Recent Labs  Lab 03/07/20 2256 03/08/20 0055 03/09/20 0048  INR 1.6* 1.7* 1.1    Cardiac Enzymes: Recent Labs  Lab 03/07/20 1247 03/08/20 0055  CKTOTAL 528* 464*    HbA1C: Hgb A1c MFr Bld  Date/Time Value Ref Range Status  03/10/2020 01:11 AM 5.8 (H) 4.8 - 5.6 % Final    Comment:    (NOTE) Pre diabetes:          5.7%-6.4%  Diabetes:              >6.4%  Glycemic control for   <7.0% adults with diabetes     CBG: Recent Labs  Lab 03/07/20 2220  GLUCAP 98     Past  Medical History  He,  has no past medical history on file.   Surgical History   History reviewed. No pertinent surgical history.   Social History   does not have a smoking history on file. He has never used smokeless tobacco. He reports current alcohol use. He reports previous drug use.   Family History   His family history is not on file.   Allergies No Known Allergies   Home Medications  Prior to Admission medications   Not on File

## 2020-03-10 NOTE — Progress Notes (Addendum)
Referring Physician(s): Kerin Salen  Supervising Physician: Ruel Favors  Patient Status:  [facility] - In-pt  Chief Complaint: "Belly pain"  Subjective:  LUQ fluid collection s/p LUQ drain in IR 03/09/2020 by Dr. Loreta Ave. Patient awake and alert sitting in bed. Complains of intermittent generalized abdominal pain. LUQ drain site c/d/i. RN at bedside.   Allergies: Patient has no known allergies.  Medications: Prior to Admission medications   Not on File     Vital Signs: BP 97/72   Pulse 62   Temp 98.4 F (36.9 C) (Oral)   Resp (!) 22   Ht 6' (1.829 m)   Wt 198 lb 10.2 oz (90.1 kg)   SpO2 95%   BMI 26.94 kg/m   Physical Exam Vitals and nursing note reviewed.  Constitutional:      General: He is not in acute distress.    Appearance: Normal appearance.  Pulmonary:     Effort: Pulmonary effort is normal. No respiratory distress.  Abdominal:     Comments: LUQ drain site with mild tenderness, no erythema, drainage, or active bleeding; approximately 10 cc clear yellow fluid in suction bulb; drain flushes/aspirates without resistance.  Skin:    General: Skin is warm and dry.  Neurological:     Mental Status: He is alert and oriented to person, place, and time.     Imaging: CT Abdomen Pelvis Wo Contrast  Result Date: 03/07/2020 CLINICAL DATA:  Dark urine. EXAM: CT ABDOMEN AND PELVIS WITHOUT CONTRAST TECHNIQUE: Multidetector CT imaging of the abdomen and pelvis was performed following the standard protocol without IV contrast. COMPARISON:  Same day chest radiograph. FINDINGS: Lower chest: Moderate bibasilar atelectasis is noted. Hepatobiliary: No focal liver abnormality is seen. There is intra and extrahepatic biliary ductal dilatation with the common bile duct measuring up to 21 mm in diameter. No definite obstructing lesion is identified. No gallstones or gallbladder wall thickening. Pancreas: A hypoechoic lesion in the pancreatic head measures 2.8 x 1.2 cm and may  represent a mass in the pancreatic head or a dilated portion of the main pancreatic duct (series 6, image 36). The pancreatic duct within the body and tail appears normal given technique. No inflammatory changes are noted surrounding the pancreas. Spleen: Normal in size without focal abnormality. Adrenals/Urinary Tract: Adrenal glands are unremarkable. Kidneys are normal, without renal calculi, focal lesion, or hydronephrosis. Bladder is unremarkable. Stomach/Bowel: Stomach is within normal limits. Appendix appears normal. No evidence of bowel wall thickening, distention, or inflammatory changes. Vascular/Lymphatic: Aortic atherosclerosis. No enlarged abdominal or pelvic lymph nodes. Reproductive: Prostate is unremarkable. Other: No abdominal wall hernia or abnormality. A moderate amount of fluid is seen between the stomach and the liver (series 3, image 21) and inferior to the stomach (series 3, image 35), likely located within the omentum. Musculoskeletal: Degenerative changes are seen in the spine. IMPRESSION: 1. Intra and extrahepatic biliary ductal dilatation with the common bile duct measuring up to 21 mm in diameter. No definite obstructing lesion is identified. 2. A hypoechoic lesion in the pancreatic head measures 2.8 x 1.2 cm and may represent a mass in the pancreatic head or a dilated portion of the main pancreatic duct. This could be further evaluated with MRCP. 3. A moderate amount of fluid between the stomach and the liver is likely located within the omentum and is nonspecific. Aortic Atherosclerosis (ICD10-I70.0). Electronically Signed   By: Romona Curls M.D.   On: 03/07/2020 14:36   DG Chest 2 View  Result Date: 03/07/2020  CLINICAL DATA:  Patient with heat stroke.  Headache. EXAM: CHEST - 2 VIEW COMPARISON:  None. FINDINGS: Normal cardiac and mediastinal contours. Low lung volumes. Bibasilar heterogeneous opacities. No pleural effusion or pneumothorax. Thoracic spine degenerative changes.  IMPRESSION: Low lung volumes with bibasilar atelectasis. Electronically Signed   By: Annia Belt M.D.   On: 03/07/2020 12:19   MR ABDOMEN MRCP W WO CONTAST  Result Date: 03/08/2020 CLINICAL DATA:  Inpatient. Jaundice. Fatigue. Abdominal pain. Dark urine. Sepsis. Biliary dilatation on noncontrast CT from 1 day prior. EXAM: MRI ABDOMEN WITHOUT AND WITH CONTRAST (INCLUDING MRCP) TECHNIQUE: Multiplanar multisequence MR imaging of the abdomen was performed both before and after the administration of intravenous contrast. Heavily T2-weighted images of the biliary and pancreatic ducts were obtained, and three-dimensional MRCP images were rendered by post processing. 3D MRCP sequence could not be performed due to tachypnea and erratic hiccups. CONTRAST:  8.57mL GADAVIST GADOBUTROL 1 MMOL/ML IV SOLN COMPARISON:  03/07/2020 unenhanced CT abdomen/pelvis. FINDINGS: Significantly motion degraded scan, limiting assessment. Lower chest: Prominent atelectasis at the dependent lung bases bilaterally. Hepatobiliary: Normal liver size and configuration. No hepatic steatosis. No liver mass. Nondistended gallbladder contains multiple layering gallstones up to 1.0 cm in diameter. Mild diffuse gallbladder wall thickening. No significant pericholecystic fluid. Diffuse mild-to-moderate intrahepatic biliary ductal dilatation with diffuse periportal edema. Dilated common bile duct (14 mm diameter). There is a suggestion of a 5 mm choledocholith in the lower third of the CBD (series 12/image 20). No discrete enhancing biliary mass. No bile duct beading. Pancreas: No discrete pancreatic mass. Mild dilatation of ventral pancreatic duct in the pancreatic head (4-5 mm diameter) with normal caliber main pancreatic duct. No pancreas divisum. Spleen: Normal size. No mass. Adrenals/Urinary Tract: Normal adrenals. No hydronephrosis. Mild renal cortical scarring in the lower left kidney. No renal masses. Stomach/Bowel: Normal non-distended stomach.  Visualized small and large bowel is normal caliber, with no bowel wall thickening. Vascular/Lymphatic: Normal caliber abdominal aorta. Patent portal, splenic, hepatic and renal veins. Mildly enlarged porta hepatis nodes up to 1.6 cm (series 1302/image 69). No additional pathologically enlarged lymph nodes in the abdomen. Other: There is a lobulated loculated 7.2 x 2.8 x 12.0 cm fluid collection in the anterior left upper peritoneal cavity just to the left of midline scalloping the anterior liver contour in the lateral segment left liver lobe, demonstrating mild wall thickening and enhancement. No additional fluid collections or ascites. Musculoskeletal: No aggressive appearing focal osseous lesions. IMPRESSION: 1. Limited motion degraded scan. 2. Cholelithiasis. Mild diffuse gallbladder wall thickening. No significant pericholecystic fluid. No gallbladder distention. 3. Diffuse mild-to-moderate intrahepatic biliary ductal dilatation with diffuse periportal edema. Dilated common bile duct (14 mm diameter) with suggestion of a 5 mm choledocholith in the lower third of the CBD. 4. Mild dilatation of the ventral pancreatic duct in the pancreatic head. No discrete pancreatic mass. 5. Lobulated loculated 7.2 x 2.8 x 12.0 cm fluid collection in the anterior left upper peritoneal cavity with mild wall thickening and enhancement, scalloping the anterior liver contour in the lateral segment left liver lobe, cannot exclude an infected collection. 6. Mild porta hepatis lymphadenopathy, nonspecific. 7. Prominent atelectasis at the dependent lung bases bilaterally. Electronically Signed   By: Delbert Phenix M.D.   On: 03/08/2020 15:23   CT IMAGE GUIDED DRAINAGE BY PERCUTANEOUS CATHETER  Result Date: 03/10/2020 INDICATION: 69 year old male with abdominal fluid collection. EXAM: CT GUIDED DRAINAGE OF ABDOMINAL ABSCESS MEDICATIONS: The patient is currently admitted to the hospital and receiving  intravenous antibiotics. The  antibiotics were administered within an appropriate time frame prior to the initiation of the procedure. ANESTHESIA/SEDATION: 1.0 mg IV Versed 50 mcg IV Fentanyl Moderate Sedation Time:  15 minutes The patient was continuously monitored during the procedure by the interventional radiology nurse under my direct supervision. COMPLICATIONS: None TECHNIQUE: Informed written consent was obtained from the patient after a thorough discussion of the procedural risks, benefits and alternatives. All questions were addressed. Maximal Sterile Barrier Technique was utilized including caps, mask, sterile gowns, sterile gloves, sterile drape, hand hygiene and skin antiseptic. A timeout was performed prior to the initiation of the procedure. PROCEDURE: The operative field was prepped with chlorhexidine in a sterile fashion, and a sterile drape was applied covering the operative field. A sterile gown and sterile gloves were used for the procedure. Local anesthesia was provided with 1% Lidocaine. Patient was positioned supine position on the CT gantry table. Scout CT was acquired for planning purposes. The patient was then prepped and draped in the usual sterile fashion. 1% lidocaine was used for local anesthesia. Using CT guidance, trocar needle was advanced into the fluid collection of the left abdomen. Modified Seldinger technique was then used to place a 12 [place] biliary drain with additional sideholes into the fluid collection. Fluid was aspirated and sent for culture and total bilirubin level. Final image was acquired. Catheter was sutured in position and attached to bulb suction. Patient tolerated the procedure well and remained hemodynamically stable throughout. No complications were encountered and no significant blood loss. FINDINGS: Similar configuration of dumbbell shaped fluid within the left abdomen. Dark green bilious fluid was aspirated and a culture and T bili fluid were sent. IMPRESSION: Status post CT-guided  drainage of left abdominal fluid collection. Signed, Yvone Neu. Reyne Dumas, RPVI [facility] Electronically Signed   By: Gilmer Mor D.O.   On: 03/10/2020 09:30    Labs:  CBC: Recent Labs    03/07/20 1051 03/07/20 1051 03/07/20 1637 03/07/20 1905 03/08/20 0055 03/10/20 0749  WBC 8.2  --   --  13.8* 24.2* 10.2  HGB 14.6   < > 12.9* 12.3* 11.4* 10.4*  HCT 44.0   < > 38.0* 35.2* 32.2* 30.0*  PLT 287  --   --  146* 129* 123*   < > = values in this interval not displayed.    COAGS: Recent Labs    03/07/20 2256 03/08/20 0055 03/09/20 0048  INR 1.6* 1.7* 1.1  APTT 37*  --   --     BMP: Recent Labs    03/07/20 1051 03/07/20 1051 03/07/20 1637 03/07/20 1905 03/07/20 2256 03/08/20 0055 03/10/20 0749  NA 123*   < > 125*  --  126* 130* 132*  K 3.3*   < > 2.9*  --  3.0* 3.1* 3.3*  CL 82*   < > 91*  --  91* 95* 99  CO2 17*  --   --   --  21* 22 25  GLUCOSE 167*   < > 91  --  111* 112* 130*  BUN 43*   < > 40*  --  43* 40* 13  CALCIUM 8.3*  --   --   --  7.4* 7.4* 7.6*  CREATININE 1.98*   < > 1.70* 1.87* 1.67* 1.63* 0.78  GFRNONAA 34*   < >  --  36* 42* 43* >60  GFRAA 39*   < >  --  42* 48* 50* >60   < > =  values in this interval not displayed.    LIVER FUNCTION TESTS: Recent Labs    03/07/20 1051 03/07/20 2256 03/09/20 0048 03/10/20 0749  BILITOT 6.8* 3.1* 1.7* 1.5*  AST 275* 137* 60* 30  ALT 434* 231* 146* 93*  ALKPHOS 160* 112 83 78  PROT 7.4 5.2* 5.4* 5.3*  ALBUMIN 3.3* 2.2* 2.0* 2.1*    Assessment and Plan:  LUQ fluid collection s/p LUQ drain in IR 03/09/2020 by Dr. Loreta AveWagner. LUQ drain stable with approximately 10 cc clear yellow fluid in suction bulb (additional 55 cc output from drain in past 24 hours per notes). Continue current drain management- continue with Qshift flushes/monitor of output. Plan for repeat CT/possible drain injection when output <10 cc/day (assess for possible  removal). Further plans per CCM/GI- appreciate and agree with management. IR to follow.   Electronically Signed: Elwin MochaAlexandra Herald Vallin, PA-C 03/10/2020, 11:12 AM   I spent a total of 25 Minutes at the the patient's bedside AND on the patient's hospital floor or unit, greater than 50% of which was counseling/coordinating care for LUQ fluid collection s/p LUQ drain placement.

## 2020-03-10 NOTE — Anesthesia Procedure Notes (Signed)
Procedure Name: Intubation Date/Time: 03/10/2020 2:16 PM Performed by: Raenette Rover, CRNA Pre-anesthesia Checklist: Patient identified, Emergency Drugs available, Suction available and Patient being monitored Patient Re-evaluated:Patient Re-evaluated prior to induction Oxygen Delivery Method: Circle system utilized Preoxygenation: Pre-oxygenation with 100% oxygen Induction Type: IV induction Ventilation: Mask ventilation without difficulty Laryngoscope Size: Mac and 4 Grade View: Grade I Tube type: Oral Tube size: 7.5 mm Number of attempts: 1 Airway Equipment and Method: Stylet Placement Confirmation: ETT inserted through vocal cords under direct vision,  positive ETCO2 and breath sounds checked- equal and bilateral Secured at: 22 cm Tube secured with: Tape Dental Injury: Teeth and Oropharynx as per pre-operative assessment

## 2020-03-10 NOTE — Progress Notes (Signed)
2150- Attempted to flush JP drain with 47mL of Normal Saline per MAR order. Pt was unable to tolerated "Stop! It hurts really bad". Pt report 7/10 constant pain at catheter site. Unable to view site due to dressing. Dressing clean, dry and intact. Will continue to monitor pt  2302- This RN called into pt room, pt stated that he coughed and "felt like something gushed out of catheter". No drainage in JP bulb. Dressing clean, dry and intact. Ice pack applied to bilateral sides and instructed to use pillow pressure during coughing. Pt stated he feels comfortable. MD on call paged and notified. Will continue to monitor pt  PRN pain med given. See MAR. Upon dressing change surgical site looks unremarkable. Scant dark brown drainage on guaze. No edema, ecchymosis, or drainage from catheter insertion site. New dressing applied. Will continue to monitor pt.

## 2020-03-10 NOTE — Op Note (Signed)
ERCP was performed for 5 mm CBD stone noted on MRCP, dilated CBD and abnormal LFTs.   Findings:  Small ulceration was noted in the ampulla which was next to a medium-sized diverticulum. The CBD was readily cannulated and on cholangiogram appeared markedly dilated at 2 cm. No obvious bile leak was noted. The CBD was swept with a 12 mm/15 mm and then 18 mm balloon starting at bifurcation. No stones were found. The pancreatic duct was never injected or cannulated during the entire procedure.  Recommendations: Resume regular diet as tolerated. Patient had a loculated lobulated 7 x 12 cm collection in anterior left upper peritoneal cavity with bilious output suspicious for a biloma?  Possible gallbladder rupture. Patient has mild diffuse gallbladder wall thickening and cholelithiasis and will benefit from surgical evaluation for cholecystectomy. Notified Dr. Vilma Meckel.  Kerin Salen, MD.

## 2020-03-10 NOTE — Anesthesia Preprocedure Evaluation (Addendum)
Anesthesia Evaluation  Patient identified by MRN, date of birth, ID band Patient awake    Reviewed: Allergy & Precautions, NPO status , Patient's Chart, lab work & pertinent test results  Airway Mallampati: II  TM Distance: >3 FB Neck ROM: Full    Dental  (+) Missing, Poor Dentition   Pulmonary neg pulmonary ROS,    Pulmonary exam normal breath sounds clear to auscultation       Cardiovascular hypertension, Pt. on home beta blockers Normal cardiovascular exam Rhythm:Regular Rate:Normal     Neuro/Psych negative neurological ROS  negative psych ROS   GI/Hepatic negative GI ROS, Neg liver ROS, CBD stone   Endo/Other  negative endocrine ROS  Renal/GU negative Renal ROS     Musculoskeletal negative musculoskeletal ROS (+)   Abdominal   Peds  Hematology  (+) Blood dyscrasia (Thrombocytopenia), anemia ,   Anesthesia Other Findings Day of surgery medications reviewed with the patient.  Reproductive/Obstetrics                           Anesthesia Physical Anesthesia Plan  ASA: III  Anesthesia Plan: General   Post-op Pain Management:    Induction: Intravenous  PONV Risk Score and Plan: 2 and Dexamethasone and Ondansetron  Airway Management Planned: Oral ETT  Additional Equipment:   Intra-op Plan:   Post-operative Plan: Extubation in OR  Informed Consent: I have reviewed the patients History and Physical, chart, labs and discussed the procedure including the risks, benefits and alternatives for the proposed anesthesia with the patient or authorized representative who has indicated his/her understanding and acceptance.   Patient has DNR.  Discussed DNR with patient and Suspend DNR.   Dental advisory given  Plan Discussed with: CRNA  Anesthesia Plan Comments:        Anesthesia Quick Evaluation

## 2020-03-10 NOTE — Transfer of Care (Signed)
Immediate Anesthesia Transfer of Care Note  Patient: Brandon Spence  Procedure(s) Performed: ENDOSCOPIC RETROGRADE CHOLANGIOPANCREATOGRAPHY (ERCP) (N/A ) SPHINCTEROTOMY REMOVAL OF STONES  Patient Location: Endoscopy Unit  Anesthesia Type:General  Level of Consciousness: awake, alert , oriented, drowsy and patient cooperative  Airway & Oxygen Therapy: Patient Spontanous Breathing and Patient connected to nasal cannula oxygen  Post-op Assessment: Report given to RN and Post -op Vital signs reviewed and stable  Post vital signs: Reviewed and stable  Last Vitals:  Vitals Value Taken Time  BP 112/69 1505  Temp    Pulse 71   Resp    SpO2 97     Last Pain:  Vitals:   03/10/20 1334  TempSrc:   PainSc: 5       Patients Stated Pain Goal: 0 (03/10/20 1034)  Complications: No complications documented.

## 2020-03-10 NOTE — Interval H&P Note (Signed)
History and Physical Interval Note: 69/male with bile collection, s/p drain placement, gallstones and 5 mm CBD stone for an ERCP.  03/10/2020 1:51 PM  Brandon Spence  has presented today for ERCP, with the diagnosis of CBD stone.  The various methods of treatment have been discussed with the patient and family. After consideration of risks, benefits and other options for treatment, the patient has consented to  Procedure(s): ENDOSCOPIC RETROGRADE CHOLANGIOPANCREATOGRAPHY (ERCP) (N/A) as a surgical intervention.  The patient's history has been reviewed, patient examined, no change in status, stable for surgery.  I have reviewed the patient's chart and labs.  Questions were answered to the patient's satisfaction.     Kerin Salen

## 2020-03-11 ENCOUNTER — Inpatient Hospital Stay (HOSPITAL_COMMUNITY): Payer: Self-pay

## 2020-03-11 DIAGNOSIS — A419 Sepsis, unspecified organism: Secondary | ICD-10-CM

## 2020-03-11 DIAGNOSIS — R7881 Bacteremia: Secondary | ICD-10-CM

## 2020-03-11 DIAGNOSIS — R6521 Severe sepsis with septic shock: Secondary | ICD-10-CM

## 2020-03-11 LAB — CULTURE, BLOOD (ROUTINE X 2): Special Requests: ADEQUATE

## 2020-03-11 LAB — CBC WITH DIFFERENTIAL/PLATELET
Abs Immature Granulocytes: 0.62 10*3/uL — ABNORMAL HIGH (ref 0.00–0.07)
Basophils Absolute: 0.1 10*3/uL (ref 0.0–0.1)
Basophils Relative: 1 %
Eosinophils Absolute: 0.2 10*3/uL (ref 0.0–0.5)
Eosinophils Relative: 2 %
HCT: 32.8 % — ABNORMAL LOW (ref 39.0–52.0)
Hemoglobin: 11 g/dL — ABNORMAL LOW (ref 13.0–17.0)
Immature Granulocytes: 5 %
Lymphocytes Relative: 8 %
Lymphs Abs: 0.9 10*3/uL (ref 0.7–4.0)
MCH: 30 pg (ref 26.0–34.0)
MCHC: 33.5 g/dL (ref 30.0–36.0)
MCV: 89.4 fL (ref 80.0–100.0)
Monocytes Absolute: 1.3 10*3/uL — ABNORMAL HIGH (ref 0.1–1.0)
Monocytes Relative: 11 %
Neutro Abs: 8.6 10*3/uL — ABNORMAL HIGH (ref 1.7–7.7)
Neutrophils Relative %: 73 %
Platelets: 147 10*3/uL — ABNORMAL LOW (ref 150–400)
RBC: 3.67 MIL/uL — ABNORMAL LOW (ref 4.22–5.81)
RDW: 13.5 % (ref 11.5–15.5)
WBC: 11.7 10*3/uL — ABNORMAL HIGH (ref 4.0–10.5)
nRBC: 0 % (ref 0.0–0.2)

## 2020-03-11 LAB — CBC
HCT: 34.9 % — ABNORMAL LOW (ref 39.0–52.0)
Hemoglobin: 11.8 g/dL — ABNORMAL LOW (ref 13.0–17.0)
MCH: 30.6 pg (ref 26.0–34.0)
MCHC: 33.8 g/dL (ref 30.0–36.0)
MCV: 90.4 fL (ref 80.0–100.0)
Platelets: 159 10*3/uL (ref 150–400)
RBC: 3.86 MIL/uL — ABNORMAL LOW (ref 4.22–5.81)
RDW: 13.7 % (ref 11.5–15.5)
WBC: 11.7 10*3/uL — ABNORMAL HIGH (ref 4.0–10.5)
nRBC: 0 % (ref 0.0–0.2)

## 2020-03-11 LAB — COMPREHENSIVE METABOLIC PANEL
ALT: 57 U/L — ABNORMAL HIGH (ref 0–44)
AST: 17 U/L (ref 15–41)
Albumin: 2.2 g/dL — ABNORMAL LOW (ref 3.5–5.0)
Alkaline Phosphatase: 62 U/L (ref 38–126)
Anion gap: 10 (ref 5–15)
BUN: 12 mg/dL (ref 8–23)
CO2: 25 mmol/L (ref 22–32)
Calcium: 7.5 mg/dL — ABNORMAL LOW (ref 8.9–10.3)
Chloride: 97 mmol/L — ABNORMAL LOW (ref 98–111)
Creatinine, Ser: 0.81 mg/dL (ref 0.61–1.24)
GFR calc Af Amer: >60 mL/min (ref >60)
GFR calc non Af Amer: >60 mL/min (ref >60)
Glucose, Bld: 108 mg/dL — ABNORMAL HIGH (ref 70–99)
Potassium: 3.2 mmol/L — ABNORMAL LOW (ref 3.5–5.1)
Sodium: 132 mmol/L — ABNORMAL LOW (ref 135–145)
Total Bilirubin: 1.2 mg/dL (ref 0.3–1.2)
Total Protein: 5.5 g/dL — ABNORMAL LOW (ref 6.5–8.1)

## 2020-03-11 LAB — SURGICAL PCR SCREEN
MRSA, PCR: NEGATIVE
Staphylococcus aureus: NEGATIVE

## 2020-03-11 LAB — LIPASE, BLOOD: Lipase: 119 U/L — ABNORMAL HIGH (ref 11–51)

## 2020-03-11 MED ORDER — GABAPENTIN 300 MG PO CAPS
300.0000 mg | ORAL_CAPSULE | ORAL | Status: AC
  Administered 2020-03-12: 300 mg via ORAL
  Filled 2020-03-11: qty 1

## 2020-03-11 MED ORDER — IOHEXOL 300 MG/ML  SOLN
100.0000 mL | Freq: Once | INTRAMUSCULAR | Status: AC | PRN
  Administered 2020-03-11: 100 mL via INTRAVENOUS

## 2020-03-11 MED ORDER — OXYCODONE HCL 5 MG PO TABS
5.0000 mg | ORAL_TABLET | Freq: Four times a day (QID) | ORAL | Status: DC | PRN
  Administered 2020-03-11 – 2020-03-12 (×3): 5 mg via ORAL
  Filled 2020-03-11 (×5): qty 1

## 2020-03-11 MED ORDER — IOHEXOL 9 MG/ML PO SOLN
500.0000 mL | ORAL | Status: AC
  Administered 2020-03-11 (×2): 500 mL via ORAL

## 2020-03-11 MED ORDER — ACETAMINOPHEN 500 MG PO TABS
1000.0000 mg | ORAL_TABLET | ORAL | Status: AC
  Administered 2020-03-12: 1000 mg via ORAL
  Filled 2020-03-11: qty 2

## 2020-03-11 MED ORDER — POTASSIUM CHLORIDE CRYS ER 20 MEQ PO TBCR
40.0000 meq | EXTENDED_RELEASE_TABLET | Freq: Once | ORAL | Status: AC
  Administered 2020-03-11: 40 meq via ORAL
  Filled 2020-03-11: qty 2

## 2020-03-11 NOTE — Progress Notes (Signed)
Patient arrived to 6N20 from 60M . Report received from [place], [place]. Patient alert and oriented x4. Dressing clean, dry, and intact. Pain 3 out of 10 at surgical site. See MAR. See assessment. Patient call bell within reach. Will continue to monitor.

## 2020-03-11 NOTE — Progress Notes (Signed)
Referring Physician(s): Dr. Marca Ancona  Supervising Physician: Irish Lack  Patient Status:  [facility] - In-pt  Chief Complaint: Abdominal pain; left abdominal fluid collection; s/p LUQ drain 03/09/20.   Subjective: Patient in bed drinking contrast for CT scan today. He endorses upper abdominal, left upper and left mid abdominal discomfort. LUQ drain in place, approximately 10-15 cc serous fluid in bulb. RN at the bedside states he is possibly going to the OR tomorrow for cholecystectomy.   Allergies: Patient has no known allergies.  Medications: Prior to Admission medications   Not on File     Vital Signs: BP 109/77 (BP Location: Right Arm)   Pulse 67   Temp 98.3 F (36.8 C) (Oral)   Resp 19   Ht 6' (1.829 m)   Wt 198 lb 10.2 oz (90.1 kg)   SpO2 97%   BMI 26.94 kg/m   Physical Exam Constitutional:      General: He is not in acute distress.    Appearance: He is not ill-appearing.  HENT:     Mouth/Throat:     Mouth: Mucous membranes are moist.  Pulmonary:     Effort: Pulmonary effort is normal.  Abdominal:     Palpations: Abdomen is soft.     Tenderness: There is abdominal tenderness.     Comments: LUQ JP drain. Dressing is clean and dry. Catheter flushed easily with 5 cc NS. Not much aspirated back. Approximately 10-15 cc serous fluid in bulb.   Skin:    General: Skin is warm and dry.  Neurological:     Mental Status: He is alert and oriented to person, place, and time  Imaging: DG ERCP BILIARY & PANCREATIC DUCTS  Result Date: 03/11/2020 CLINICAL DATA:  Bile duct stone. EXAM: ERCP TECHNIQUE: Multiple spot images obtained with the fluoroscopic device and submitted for interpretation post-procedure. FLUOROSCOPY TIME:  Fluoroscopy Time:  2 minutes and 43 seconds Radiation Exposure Index (if provided by the fluoroscopic device): 31.84 mGy COMPARISON:  MRI 03/08/2020 FINDINGS: Retrograde cholangiogram demonstrates a dilated biliary system. Wire was advanced into a left  hepatic duct. Questionable small filling defect in the distal common bile duct. Evidence for a balloon sweep. Percutaneous drain in the left upper abdomen. IMPRESSION: 1. Dilated biliary system. 2. Questionable filling defect in the distal common bile duct could represent a small stone. Evidence for balloon sweep. 3. Patent cystic duct. These images were submitted for radiologic interpretation only. Please see the procedural report for the amount of contrast and the fluoroscopy time utilized. Electronically Signed   By: Richarda Overlie M.D.   On: 03/11/2020 08:17   MR ABDOMEN MRCP W WO CONTAST  Result Date: 03/08/2020 CLINICAL DATA:  Inpatient. Jaundice. Fatigue. Abdominal pain. Dark urine. Sepsis. Biliary dilatation on noncontrast CT from 1 day prior. EXAM: MRI ABDOMEN WITHOUT AND WITH CONTRAST (INCLUDING MRCP) TECHNIQUE: Multiplanar multisequence MR imaging of the abdomen was performed both before and after the administration of intravenous contrast. Heavily T2-weighted images of the biliary and pancreatic ducts were obtained, and three-dimensional MRCP images were rendered by post processing. 3D MRCP sequence could not be performed due to tachypnea and erratic hiccups. CONTRAST:  8.32mL GADAVIST GADOBUTROL 1 MMOL/ML IV SOLN COMPARISON:  03/07/2020 unenhanced CT abdomen/pelvis. FINDINGS: Significantly motion degraded scan, limiting assessment. Lower chest: Prominent atelectasis at the dependent lung bases bilaterally. Hepatobiliary: Normal liver size and configuration. No hepatic steatosis. No liver mass. Nondistended gallbladder contains multiple layering gallstones up to 1.0 cm in diameter. Mild diffuse gallbladder wall  thickening. No significant pericholecystic fluid. Diffuse mild-to-moderate intrahepatic biliary ductal dilatation with diffuse periportal edema. Dilated common bile duct (14 mm diameter). There is a suggestion of a 5 mm choledocholith in the lower third of the CBD (series 12/image 20). No discrete  enhancing biliary mass. No bile duct beading. Pancreas: No discrete pancreatic mass. Mild dilatation of ventral pancreatic duct in the pancreatic head (4-5 mm diameter) with normal caliber main pancreatic duct. No pancreas divisum. Spleen: Normal size. No mass. Adrenals/Urinary Tract: Normal adrenals. No hydronephrosis. Mild renal cortical scarring in the lower left kidney. No renal masses. Stomach/Bowel: Normal non-distended stomach. Visualized small and large bowel is normal caliber, with no bowel wall thickening. Vascular/Lymphatic: Normal caliber abdominal aorta. Patent portal, splenic, hepatic and renal veins. Mildly enlarged porta hepatis nodes up to 1.6 cm (series 1302/image 69). No additional pathologically enlarged lymph nodes in the abdomen. Other: There is a lobulated loculated 7.2 x 2.8 x 12.0 cm fluid collection in the anterior left upper peritoneal cavity just to the left of midline scalloping the anterior liver contour in the lateral segment left liver lobe, demonstrating mild wall thickening and enhancement. No additional fluid collections or ascites. Musculoskeletal: No aggressive appearing focal osseous lesions. IMPRESSION: 1. Limited motion degraded scan. 2. Cholelithiasis. Mild diffuse gallbladder wall thickening. No significant pericholecystic fluid. No gallbladder distention. 3. Diffuse mild-to-moderate intrahepatic biliary ductal dilatation with diffuse periportal edema. Dilated common bile duct (14 mm diameter) with suggestion of a 5 mm choledocholith in the lower third of the CBD. 4. Mild dilatation of the ventral pancreatic duct in the pancreatic head. No discrete pancreatic mass. 5. Lobulated loculated 7.2 x 2.8 x 12.0 cm fluid collection in the anterior left upper peritoneal cavity with mild wall thickening and enhancement, scalloping the anterior liver contour in the lateral segment left liver lobe, cannot exclude an infected collection. 6. Mild porta hepatis lymphadenopathy,  nonspecific. 7. Prominent atelectasis at the dependent lung bases bilaterally. Electronically Signed   By: Delbert Phenix M.D.   On: 03/08/2020 15:23   CT IMAGE GUIDED DRAINAGE BY PERCUTANEOUS CATHETER  Result Date: 03/10/2020 INDICATION: 69 year old male with abdominal fluid collection. EXAM: CT GUIDED DRAINAGE OF ABDOMINAL ABSCESS MEDICATIONS: The patient is currently admitted to the hospital and receiving intravenous antibiotics. The antibiotics were administered within an appropriate time frame prior to the initiation of the procedure. ANESTHESIA/SEDATION: 1.0 mg IV Versed 50 mcg IV Fentanyl Moderate Sedation Time:  15 minutes The patient was continuously monitored during the procedure by the interventional radiology nurse under my direct supervision. COMPLICATIONS: None TECHNIQUE: Informed written consent was obtained from the patient after a thorough discussion of the procedural risks, benefits and alternatives. All questions were addressed. Maximal Sterile Barrier Technique was utilized including caps, mask, sterile gowns, sterile gloves, sterile drape, hand hygiene and skin antiseptic. A timeout was performed prior to the initiation of the procedure. PROCEDURE: The operative field was prepped with chlorhexidine in a sterile fashion, and a sterile drape was applied covering the operative field. A sterile gown and sterile gloves were used for the procedure. Local anesthesia was provided with 1% Lidocaine. Patient was positioned supine position on the CT gantry table. Scout CT was acquired for planning purposes. The patient was then prepped and draped in the usual sterile fashion. 1% lidocaine was used for local anesthesia. Using CT guidance, trocar needle was advanced into the fluid collection of the left abdomen. Modified Seldinger technique was then used to place a 12 [place] biliary drain with  additional sideholes into the fluid collection. Fluid was aspirated and sent for culture and total bilirubin  level. Final image was acquired. Catheter was sutured in position and attached to bulb suction. Patient tolerated the procedure well and remained hemodynamically stable throughout. No complications were encountered and no significant blood loss. FINDINGS: Similar configuration of dumbbell shaped fluid within the left abdomen. Dark green bilious fluid was aspirated and a culture and T bili fluid were sent. IMPRESSION: Status post CT-guided drainage of left abdominal fluid collection. Signed, Yvone Neu. Reyne Dumas, RPVI [facility] Radiology Electronically Signed   By: Gilmer Mor D.O.   On: 03/10/2020 09:30    Labs:  CBC: Recent Labs    03/07/20 1905 03/08/20 0055 03/10/20 0749 03/11/20 1103  WBC 13.8* 24.2* 10.2 11.7*  HGB 12.3* 11.4* 10.4* 11.8*  HCT 35.2* 32.2* 30.0* 34.9*  PLT 146* 129* 123* 159    COAGS: Recent Labs    03/07/20 2256 03/08/20 0055 03/09/20 0048  INR 1.6* 1.7* 1.1  APTT 37*  --   --     BMP: Recent Labs    03/07/20 2256 03/08/20 0055 03/10/20 0749 03/11/20 1103  NA 126* 130* 132* 132*  K 3.0* 3.1* 3.3* 3.2*  CL 91* 95* 99 97*  CO2 21* 22 25 25   GLUCOSE 111* 112* 130* 108*  BUN 43* 40* 13 12  CALCIUM 7.4* 7.4* 7.6* 7.5*  CREATININE 1.67* 1.63* 0.78 0.81  GFRNONAA 42* 43* >60 >60  GFRAA 48* 50* >60 >60    LIVER FUNCTION TESTS: Recent Labs    03/07/20 2256 03/09/20 0048 03/10/20 0749 03/11/20 1103  BILITOT 3.1* 1.7* 1.5* 1.2  AST 137* 60* 30 17  ALT 231* 146* 93* 57*  ALKPHOS 112 83 78 62  PROT 5.2* 5.4* 5.3* 5.5*  ALBUMIN 2.2* 2.0* 2.1* 2.2*    Assessment and Plan:  Left upper quadrant fluid collection, s/p LUQ drain in IR 03/09/20: Drain stable with 20 cc output documented in Epic and another 10-15 cc in bulb. Afebrile, WBC stable. CT abdomen/pelvis ordered by surgery is pending. Surgery is planning to perform cholecystectomy tomorrow 03/12/20.   Continue current drain management - flush  every shift, document output. Further plans per primary teams. IR will continue to follow.   Electronically Signed: 03/14/20, AGACNP-BC [phone] 03/11/2020, 2:21 PM   I spent a total of 15 Minutes at the the patient's bedside AND on the patient's hospital floor or unit, greater than 50% of which was counseling/coordinating care for abdominal fluid collection/left upper quadrant drain.

## 2020-03-11 NOTE — Progress Notes (Signed)
Subjective: Patient complains of continued left upper quadrant abdominal pain. He was able to tolerate his diet without associated nausea or vomiting.  Objective: Vital signs in last 24 hours: Temp:  [98.1 F (36.7 C)-99.5 F (37.5 C)] 98.3 F (36.8 C) (08/19 0459) Pulse Rate:  [62-79] 68 (08/19 0459) Resp:  [16-28] 18 (08/19 0459) BP: (96-126)/(68-84) 104/75 (08/19 0459) SpO2:  [92 %-97 %] 95 % (08/19 0459) Weight:  [90.1 kg] 90.1 kg (08/18 1334) Weight change: 0 kg Last BM Date:  (PTA)  PE: In mild distress due to epigastric and left upper quadrant abdominal pain GENERAL: JP drain in place with light bilious fluid ABDOMEN: Severe left upper quadrant and epigastric pain, mild generalized tenderness, normal active bowel sounds EXTREMITIES: No deformity, no edema  Lab Results: Results for orders placed or performed during the hospital encounter of 03/07/20 (from the past 48 hour(s))  Aerobic/Anaerobic Culture (surgical/deep wound)     Status: None (Preliminary result)   Collection Time: 03/09/20  3:57 PM   Specimen: Abscess  Result Value Ref Range   Specimen Description ABSCESS ABDOMEN    Special Requests NONE    Gram Stain      RARE WBC PRESENT,BOTH PMN AND MONONUCLEAR NO ORGANISMS SEEN Performed at [facility], [address], [place], [place] [address]    Culture PENDING    Report Status PENDING   Hemoglobin A1c     Status: Abnormal   Collection Time: 03/10/20  1:11 AM  Result Value Ref Range   Hgb A1c MFr Bld 5.8 (H) 4.8 - 5.6 %    Comment: (NOTE) Pre diabetes:          5.7%-6.4%  Diabetes:              >6.4%  Glycemic control for   <7.0% adults with diabetes    Mean Plasma Glucose 119.76 mg/dL    Comment: Performed at [facility], [address], [place], [place] [address]  CBC     Status: Abnormal   Collection Time: 03/10/20  7:49 AM  Result Value Ref Range   WBC 10.2 4.0 - 10.5 K/uL   RBC 3.37 (L) 4.22 - 5.81 MIL/uL   Hemoglobin 10.4 (L)  13.0 - 17.0 g/dL   HCT 77.4 (L) 39 - 52 %   MCV 89.0 80.0 - 100.0 fL   MCH 30.9 26.0 - 34.0 pg   MCHC 34.7 30.0 - 36.0 g/dL   RDW 12.8 78.6 - 76.7 %   Platelets 123 (L) 150 - 400 K/uL   nRBC 0.0 0.0 - 0.2 %    Comment: Performed at [facility], [address], [place], [place] [address]  Comprehensive metabolic panel     Status: Abnormal   Collection Time: 03/10/20  7:49 AM  Result Value Ref Range   Sodium 132 (L) 135 - 145 mmol/L   Potassium 3.3 (L) 3.5 - 5.1 mmol/L   Chloride 99 98 - 111 mmol/L   CO2 25 22 - 32 mmol/L   Glucose, Bld 130 (H) 70 - 99 mg/dL    Comment: Glucose reference range applies only to samples taken after fasting for at least 8 hours.   BUN 13 8 - 23 mg/dL   Creatinine, Ser 0.96 0.61 - 1.24 mg/dL   Calcium 7.6 (L) 8.9 - 10.3 mg/dL   Total Protein 5.3 (L) 6.5 - 8.1 g/dL   Albumin 2.1 (L) 3.5 - 5.0 g/dL   AST 30 15 -  41 U/L   ALT 93 (H) 0 - 44 U/L   Alkaline Phosphatase 78 38 - 126 U/L   Total Bilirubin 1.5 (H) 0.3 - 1.2 mg/dL   GFR calc non Af Amer >60 >60 mL/min   GFR calc Af Amer >60 >60 mL/min   Anion gap 8 5 - 15    Comment: Performed at [facility], [address] [place], [place] [address]    Studies/Results: DG ERCP BILIARY & PANCREATIC DUCTS  Result Date: 03/11/2020 CLINICAL DATA:  Bile duct stone. EXAM: ERCP TECHNIQUE: Multiple spot images obtained with the fluoroscopic device and submitted for interpretation post-procedure. FLUOROSCOPY TIME:  Fluoroscopy Time:  2 minutes and 43 seconds Radiation Exposure Index (if provided by the fluoroscopic device): 31.84 mGy COMPARISON:  MRI 03/08/2020 FINDINGS: Retrograde cholangiogram demonstrates a dilated biliary system. Wire was advanced into a left hepatic duct. Questionable small filling defect in the distal common bile duct. Evidence for a balloon sweep. Percutaneous drain in the left upper abdomen. IMPRESSION: 1. Dilated biliary system. 2. Questionable filling defect in the distal common  bile duct could represent a small stone. Evidence for balloon sweep. 3. Patent cystic duct. These images were submitted for radiologic interpretation only. Please see the procedural report for the amount of contrast and the fluoroscopy time utilized. Electronically Signed   By: Richarda Overlie M.D.   On: 03/11/2020 08:17   CT IMAGE GUIDED DRAINAGE BY PERCUTANEOUS CATHETER  Result Date: 03/10/2020 INDICATION: 69 year old male with abdominal fluid collection. EXAM: CT GUIDED DRAINAGE OF ABDOMINAL ABSCESS MEDICATIONS: The patient is currently admitted to the hospital and receiving intravenous antibiotics. The antibiotics were administered within an appropriate time frame prior to the initiation of the procedure. ANESTHESIA/SEDATION: 1.0 mg IV Versed 50 mcg IV Fentanyl Moderate Sedation Time:  15 minutes The patient was continuously monitored during the procedure by the interventional radiology nurse under my direct supervision. COMPLICATIONS: None TECHNIQUE: Informed written consent was obtained from the patient after a thorough discussion of the procedural risks, benefits and alternatives. All questions were addressed. Maximal Sterile Barrier Technique was utilized including caps, mask, sterile gowns, sterile gloves, sterile drape, hand hygiene and skin antiseptic. A timeout was performed prior to the initiation of the procedure. PROCEDURE: The operative field was prepped with chlorhexidine in a sterile fashion, and a sterile drape was applied covering the operative field. A sterile gown and sterile gloves were used for the procedure. Local anesthesia was provided with 1% Lidocaine. Patient was positioned supine position on the CT gantry table. Scout CT was acquired for planning purposes. The patient was then prepped and draped in the usual sterile fashion. 1% lidocaine was used for local anesthesia. Using CT guidance, trocar needle was advanced into the fluid collection of the left abdomen. Modified Seldinger  technique was then used to place a 12 [place] biliary drain with additional sideholes into the fluid collection. Fluid was aspirated and sent for culture and total bilirubin level. Final image was acquired. Catheter was sutured in position and attached to bulb suction. Patient tolerated the procedure well and remained hemodynamically stable throughout. No complications were encountered and no significant blood loss. FINDINGS: Similar configuration of dumbbell shaped fluid within the left abdomen. Dark green bilious fluid was aspirated and a culture and T bili fluid were sent. IMPRESSION: Status post CT-guided drainage of left abdominal fluid collection. Signed, Yvone Neu. Reyne Dumas, RPVI [facility] Radiology Electronically Signed   By: Gilmer Mor  D.O.   On: 03/10/2020 09:30    Medications: I have reviewed the patient's current medications.  Assessment: Dumbbell shaped fluid within left abdomen with dark green bilious fluid on aspiration, status post drain placement by IR Source of bile fluid collection in left upper abdomen unclear?  Ruptured gallbladder Lobulated, loculated 7.2 x 2.8 x 12 cm fluid collection in anterior left peritoneal cavity with bilious fluid inside?  Ruptured gallbladder Cholelithiasis with mild diffuse gallbladder wall thickening noted  ERCP performed yesterday with sphincterotomy and several balloon sweeps, no evidence of CBD stone or bile leak, dilated CBD noted Liver enzymes normal, T bili 1.5/AST 30/ALT 93/ALP 78 Hemodynamically stable, afebrile, normotensive, normal heart rate and respiratory rate     Plan: Recommend surgical evaluation for cholecystectomy  Patient in continued severe pain-continue IV ceftriaxone and IV Flagyl  Patient will likely need a CAT scan in the next day or 2 to reevaluate fluid collection.  Kerin Salen, MD 03/11/2020, 9:35 AM

## 2020-03-11 NOTE — Progress Notes (Addendum)
PROGRESS NOTE    [name]  MEQ:[id number] DOB: Jan 12, 1951 DOA: 03/07/2020 PCP: Patient, No Pcp Per    Chief Complaint  Patient presents with  . Dehydration    Brief Narrative:  Brandon Spence is a 69 year old male without known PMH who presents to ED 8/15 with dark urine following progressive feelings of general unwellness, weakness, fatigue x 3 days after prolonged sun exposure. He also reported severe cramping abdominal pain that started on 8/14. The pain was diffuse and intermittent.   In ED, met sepsis criteria and was resuscitated with 30cc/kg, started on empiric abx. Initial labs reveal myriad of metabolic derrangements including Na 123 K 3.3 Co2 17 Cr 1.98 AG 24 Alk phos 160 AST 275 ALT 434 Tbili 6.8. On exam he was jaundiced and sent for CT abdomen pelvis which revealed intra and extrahepatic biliary ductal dilation, CBD measuring 67m. There is a hypoecoic lesion in the pancreatic head measuring 2.8 x 1.2cm.   Patient is complaining of fatigue, belching and some abdominal pain. Blood pressure remained 80/60 despite fluid resuscitation.  He is started on pressors and admitted to ICU.  Significant Diagnostic Tests:  8/15 CT a/p noncon> intra and extrahepatic biliary ductal dilatation, common bile duct 2102mwithout definite obstructing lesion. Pancreatic head with hypoechoic lesion 2.8cm x 1.2 cm (mass vs dilated portion of main pancreatic duct?) Moderate amount of fluid between stomach and liver.   Micro Data:  8/15 SARS Cov2 neg 8/15 BCx> 8/15 UA>  Antimicrobials:  8/15 vanc 8/15 cefepime  8/15 Flagyl>> 8/15 ceftriaxone>>   Subjective:  C/o left sided pain Assessment & Plan:   Active Problems:   Pressure injury of skin   Abscess of abdominal cavity (HCC)   Hyperbilirubinemia   Dilated bile duct   Shock likely due to hypovolemia and septic shock from biliary abdomen source -He is off pressor, transferred out of ICU to hospitalist service -Blood  culture positive for Klebsiella pneumoniae, abdominal drain culture no growth, SARS-CoV-2 screening negative, MRSA screening negative -Continue Rocephin and Flagyl for now, wbc trending down towards normal  Gallstones /biliary ductal dilatation/possible CBD stone /abdominal fluid collection/lft elevation -Status post IR drain on August 18 -Status post ERCP on August 18, no CBD stone found, The ampulla had a small ulcer, possibly due to passed CBD stone.  Status post sphincterotomy -lft normalized today -Patient continues have significant left-sided abdominal pain, repeat ct ab/pel today, check lipase -General surgery consulted, probably going to the OR for cholecystectomy tomorrow  AKI -BUN 43/creatinine 1.98 on presentation -Likely from dehydration and sepsis, CK also mildly elevated on presentation 528, CK trending down on repeat -Renal function normalized since August 18  Normocytic anemia -Hemoglobin 11.8, no sign of bleeding -Monitor  Hyponatremia -Sodium 123 on presentation -Likely from dehydration, he did report drink 1 can of beer daily -Improving, sodium 132 today   Hypokalemia  replace K check mag  Thrombocytopenia , likely reactive due to sepsis nadir at 129, platelet normalized on August 19  Elevated fasting blood glucose, possibly stress-induced A1c 5.8   DVT prophylaxis: enoxaparin (LOVENOX) injection 40 mg Start: 03/10/20 1100 SCDs Start: 03/07/20 1645   Code Status: DNR Family Communication: Patient Disposition:   Status is: Inpatient   Dispo: The patient is from: Home              Anticipated d/c is to: Home              Anticipated d/c date is: To be determined  Patient currently need to go to the OR for cholecystectomy tomorrow  Consultants:   Critical care admission, transfer to hospitalist on August 19  GI  General surgery  IR  Procedures:   IR drain placement on August 18  ERCP with sphincterotomy  on August  18  Antimicrobials:   8/15 vanc 8/15 cefepime  8/15 Flagyl>> 8/15 ceftriaxone>>     Objective: Vitals:   03/10/20 1800 03/10/20 2029 03/11/20 0126 03/11/20 0459  BP: 105/71 107/69 96/68 104/75  Pulse: 72 79 75 68  Resp: (!) '23 16 18 18  ' Temp:  99.5 F (37.5 C) 98.8 F (37.1 C) 98.3 F (36.8 C)  TempSrc:  Oral Oral Oral  SpO2: 92% 95% 94% 95%  Weight:      Height:        Intake/Output Summary (Last 24 hours) at 03/11/2020 0856 Last data filed at 03/10/2020 2140 Gross per 24 hour  Intake 1078.33 ml  Output 1020 ml  Net 58.33 ml   Filed Weights   03/09/20 0500 03/10/20 0458 03/10/20 1334  Weight: 89.8 kg 90.1 kg 90.1 kg    Examination:  General exam: calm, NAD Respiratory system: Clear to auscultation. Respiratory effort normal. Cardiovascular system: S1 & S2 heard, RRR. No JVD, no murmur, No pedal edema. Gastrointestinal system: Drain to left upper quadrant with associated tenderness, abdomen is nondistended,  Normal bowel sounds heard. Central nervous system: Alert and oriented. No focal neurological deficits. Extremities: Symmetric 5 x 5 power. Skin: No rashes, lesions or ulcers Psychiatry: Judgement and insight appear normal. Mood & affect appropriate.     Data Reviewed: I have personally reviewed following labs and imaging studies  CBC: Recent Labs  Lab 03/07/20 1051 03/07/20 1637 03/07/20 1905 03/08/20 0055 03/10/20 0749  WBC 8.2  --  13.8* 24.2* 10.2  NEUTROABS 7.5  --   --   --   --   HGB 14.6 12.9* 12.3* 11.4* 10.4*  HCT 44.0 38.0* 35.2* 32.2* 30.0*  MCV 90.7  --  89.1 89.0 89.0  PLT 287  --  146* 129* 123*    Basic Metabolic Panel: Recent Labs  Lab 03/07/20 1051 03/07/20 1051 03/07/20 1637 03/07/20 1905 03/07/20 2256 03/08/20 0055 03/08/20 1930 03/09/20 0826 03/10/20 0749  NA 123*  --  125*  --  126* 130*  --   --  132*  K 3.3*  --  2.9*  --  3.0* 3.1*  --   --  3.3*  CL 82*  --  91*  --  91* 95*  --   --  99  CO2 17*  --    --   --  21* 22  --   --  25  GLUCOSE 167*  --  91  --  111* 112*  --   --  130*  BUN 43*  --  40*  --  43* 40*  --   --  13  CREATININE 1.98*   < > 1.70* 1.87* 1.67* 1.63*  --   --  0.78  CALCIUM 8.3*  --   --   --  7.4* 7.4*  --   --  7.6*  MG  --   --   --   --   --   --  1.6* 1.8  --    < > = values in this interval not displayed.    GFR: Estimated Creatinine Clearance: 98.3 mL/min (by C-G formula based on SCr of 0.78 mg/dL).  Liver Function Tests:  Recent Labs  Lab 03/07/20 1051 03/07/20 2256 03/09/20 0048 03/10/20 0749  AST 275* 137* 60* 30  ALT 434* 231* 146* 93*  ALKPHOS 160* 112 83 78  BILITOT 6.8* 3.1* 1.7* 1.5*  PROT 7.4 5.2* 5.4* 5.3*  ALBUMIN 3.3* 2.2* 2.0* 2.1*    CBG: Recent Labs  Lab 03/07/20 2220  GLUCAP 98     Recent Results (from the past 240 hour(s))  Blood Culture (routine x 2)     Status: Abnormal   Collection Time: 03/07/20 12:53 PM   Specimen: BLOOD  Result Value Ref Range Status   Specimen Description BLOOD RIGHT ANTECUBITAL  Final   Special Requests   Final    BOTTLES DRAWN AEROBIC AND ANAEROBIC Blood Culture adequate volume   Culture  Setup Time   Final    GRAM NEGATIVE RODS IN BOTH AEROBIC AND ANAEROBIC BOTTLES CRITICAL RESULT CALLED TO, READ BACK BY AND VERIFIED WITH: Serita Grammes [id number] 03/08/2020 Mena Goes Performed at [facility], [address], [place], [place] [address]    Culture KLEBSIELLA PNEUMONIAE (A)  Final   Report Status 03/11/2020 FINAL  Final   Organism ID, Bacteria KLEBSIELLA PNEUMONIAE  Final      Susceptibility   Klebsiella pneumoniae - MIC*    AMPICILLIN RESISTANT Resistant     CEFAZOLIN <=4 SENSITIVE Sensitive     CEFEPIME <=0.12 SENSITIVE Sensitive     CEFTAZIDIME <=1 SENSITIVE Sensitive     CEFTRIAXONE <=0.25 SENSITIVE Sensitive     CIPROFLOXACIN <=0.25 SENSITIVE Sensitive     GENTAMICIN <=1 SENSITIVE Sensitive     IMIPENEM <=0.25 SENSITIVE Sensitive     TRIMETH/SULFA <=20 SENSITIVE Sensitive      AMPICILLIN/SULBACTAM <=2 SENSITIVE Sensitive     PIP/TAZO <=4 SENSITIVE Sensitive     * KLEBSIELLA PNEUMONIAE  Blood Culture ID Panel (Reflexed)     Status: Abnormal   Collection Time: 03/07/20 12:53 PM  Result Value Ref Range Status   Enterococcus faecalis NOT DETECTED NOT DETECTED Final   Enterococcus Faecium NOT DETECTED NOT DETECTED Final   Listeria monocytogenes NOT DETECTED NOT DETECTED Final   Staphylococcus species NOT DETECTED NOT DETECTED Final   Staphylococcus aureus (BCID) NOT DETECTED NOT DETECTED Final   Staphylococcus epidermidis NOT DETECTED NOT DETECTED Final   Staphylococcus lugdunensis NOT DETECTED NOT DETECTED Final   Streptococcus species NOT DETECTED NOT DETECTED Final   Streptococcus agalactiae NOT DETECTED NOT DETECTED Final   Streptococcus pneumoniae NOT DETECTED NOT DETECTED Final   Streptococcus pyogenes NOT DETECTED NOT DETECTED Final   A.calcoaceticus-baumannii NOT DETECTED NOT DETECTED Final   Bacteroides fragilis NOT DETECTED NOT DETECTED Final   Enterobacterales DETECTED (A) NOT DETECTED Final    Comment: Enterobacterales represent a large order of gram negative bacteria, not a single organism. CRITICAL RESULT CALLED TO, READ BACK BY AND VERIFIED WITH: J. LEDFORD,PHARMD [id number] 03/08/2020 T. TYSOR    Enterobacter cloacae complex NOT DETECTED NOT DETECTED Final   Escherichia coli NOT DETECTED NOT DETECTED Final   Klebsiella aerogenes NOT DETECTED NOT DETECTED Final   Klebsiella oxytoca NOT DETECTED NOT DETECTED Final   Klebsiella pneumoniae DETECTED (A) NOT DETECTED Final    Comment: CRITICAL RESULT CALLED TO, READ BACK BY AND VERIFIED WITH: J. LEDFORD,PHARMD [id number] 03/08/2020 T. TYSOR    Proteus species NOT DETECTED NOT DETECTED Final   Salmonella species NOT DETECTED NOT DETECTED Final   Serratia marcescens NOT DETECTED NOT DETECTED Final   Haemophilus influenzae NOT DETECTED NOT DETECTED Final  Neisseria meningitidis NOT DETECTED NOT DETECTED Final    Pseudomonas aeruginosa NOT DETECTED NOT DETECTED Final   Stenotrophomonas maltophilia NOT DETECTED NOT DETECTED Final   Candida albicans NOT DETECTED NOT DETECTED Final   Candida auris NOT DETECTED NOT DETECTED Final   Candida glabrata NOT DETECTED NOT DETECTED Final   Candida krusei NOT DETECTED NOT DETECTED Final   Candida parapsilosis NOT DETECTED NOT DETECTED Final   Candida tropicalis NOT DETECTED NOT DETECTED Final   Cryptococcus neoformans/gattii NOT DETECTED NOT DETECTED Final   CTX-M ESBL NOT DETECTED NOT DETECTED Final   Carbapenem resistance IMP NOT DETECTED NOT DETECTED Final   Carbapenem resistance KPC NOT DETECTED NOT DETECTED Final   Carbapenem resistance NDM NOT DETECTED NOT DETECTED Final   Carbapenem resist OXA 48 LIKE NOT DETECTED NOT DETECTED Final   Carbapenem resistance VIM NOT DETECTED NOT DETECTED Final    Comment: Performed at [facility], [address] [place], [place] [address]  Blood Culture (routine x 2)     Status: Abnormal   Collection Time: 03/07/20  1:22 PM   Specimen: BLOOD LEFT FOREARM  Result Value Ref Range Status   Specimen Description BLOOD LEFT FOREARM  Final   Special Requests   Final    BOTTLES DRAWN AEROBIC AND ANAEROBIC Blood Culture results may not be optimal due to an inadequate volume of blood received in culture bottles   Culture  Setup Time   Final    GRAM NEGATIVE RODS IN BOTH AEROBIC AND ANAEROBIC BOTTLES CRITICAL RESULT CALLED TO, READ BACK BY AND VERIFIED WITH: J. LEDFORD,PHARMD [id number] 03/08/2020 T. TYSOR    Culture (A)  Final    KLEBSIELLA PNEUMONIAE SUSCEPTIBILITIES PERFORMED ON PREVIOUS CULTURE WITHIN THE LAST 5 DAYS. Performed at [facility], [place] [address] [place], [place] [address]    Report Status 03/11/2020 FINAL  Final  SARS Coronavirus 2 by RT PCR (hospital order, performed in [facility] hospital lab) Nasopharyngeal Nasopharyngeal Swab     Status: None   Collection Time: 03/07/20  1:22 PM   Specimen:  Nasopharyngeal Swab  Result Value Ref Range Status   SARS Coronavirus 2 NEGATIVE NEGATIVE Final    Comment: (NOTE) SARS-CoV-2 target nucleic acids are NOT DETECTED.  The SARS-CoV-2 RNA is generally detectable in upper and lower respiratory specimens during the acute phase of infection. The lowest concentration of SARS-CoV-2 viral copies this assay can detect is 250 copies / mL. A negative result does not preclude SARS-CoV-2 infection and should not be used as the sole basis for treatment or other patient management decisions.  A negative result may occur with improper specimen collection / handling, submission of specimen other than nasopharyngeal swab, presence of viral mutation(s) within the areas targeted by this assay, and inadequate number of viral copies (<250 copies / mL). A negative result must be combined with clinical observations, patient history, and epidemiological information.  Fact Sheet for Patients:   StrictlyIdeas.no  Fact Sheet for Healthcare Providers: BankingDealers.co.za  This test is not yet approved or  cleared by the [place] FDA and has been authorized for detection and/or diagnosis of SARS-CoV-2 by FDA under an Emergency Use Authorization (EUA).  This EUA will remain in effect (meaning this test can be used) for the duration of the COVID-19 declaration under Section 564(b)(1) of the Act, 21 U.S.C. section 360bbb-3(b)(1), unless the authorization is terminated or revoked sooner.  Performed at [facility], [place] [address] [place],  [address]  Urine culture     Status: None   Collection Time: 03/07/20  1:24 PM   Specimen: In/Out Cath Urine  Result Value Ref Range Status   Specimen Description IN/OUT CATH URINE  Final   Special Requests NONE  Final   Culture   Final    NO GROWTH Performed at [facility], [place] [address] [place], [place] [address]    Report Status 03/08/2020  FINAL  Final  MRSA PCR Screening     Status: None   Collection Time: 03/07/20 10:24 PM   Specimen: Nasal Mucosa; Nasopharyngeal  Result Value Ref Range Status   MRSA by PCR NEGATIVE NEGATIVE Final    Comment:        The GeneXpert MRSA Assay (FDA approved for NASAL specimens only), is one component of a comprehensive MRSA colonization surveillance program. It is not intended to diagnose MRSA infection nor to guide or monitor treatment for MRSA infections. Performed at [facility], [place] [address] [place], [place] [address]   Aerobic/Anaerobic Culture (surgical/deep wound)     Status: None (Preliminary result)   Collection Time: 03/09/20  3:57 PM   Specimen: Abscess  Result Value Ref Range Status   Specimen Description ABSCESS ABDOMEN  Final   Special Requests NONE  Final   Gram Stain   Final    RARE WBC PRESENT,BOTH PMN AND MONONUCLEAR NO ORGANISMS SEEN Performed at [facility], [place] [address] [place], [place] [address]    Culture PENDING  Incomplete   Report Status PENDING  Incomplete         Radiology Studies: DG ERCP BILIARY & PANCREATIC DUCTS  Result Date: 03/11/2020 CLINICAL DATA:  Bile duct stone. EXAM: ERCP TECHNIQUE: Multiple spot images obtained with the fluoroscopic device and submitted for interpretation post-procedure. FLUOROSCOPY TIME:  Fluoroscopy Time:  2 minutes and 43 seconds Radiation Exposure Index (if provided by the fluoroscopic device): 31.84 mGy COMPARISON:  MRI 03/08/2020 FINDINGS: Retrograde cholangiogram demonstrates a dilated biliary system. Wire was advanced into a left hepatic duct. Questionable small filling defect in the distal common bile duct. Evidence for a balloon sweep. Percutaneous drain in the left upper abdomen. IMPRESSION: 1. Dilated biliary system. 2. Questionable filling defect in the distal common bile duct could represent a small stone. Evidence for balloon sweep. 3. Patent cystic duct. These images were submitted for  radiologic interpretation only. Please see the procedural report for the amount of contrast and the fluoroscopy time utilized. Electronically Signed   By: Markus Daft M.D.   On: 03/11/2020 08:17   CT IMAGE GUIDED DRAINAGE BY PERCUTANEOUS CATHETER  Result Date: 03/10/2020 INDICATION: 69 year old male with abdominal fluid collection. EXAM: CT GUIDED DRAINAGE OF ABDOMINAL ABSCESS MEDICATIONS: The patient is currently admitted to the hospital and receiving intravenous antibiotics. The antibiotics were administered within an appropriate time frame prior to the initiation of the procedure. ANESTHESIA/SEDATION: 1.0 mg IV Versed 50 mcg IV Fentanyl Moderate Sedation Time:  15 minutes The patient was continuously monitored during the procedure by the interventional radiology nurse under my direct supervision. COMPLICATIONS: None TECHNIQUE: Informed written consent was obtained from the patient after a thorough discussion of the procedural risks, benefits and alternatives. All questions were addressed. Maximal Sterile Barrier Technique was utilized including caps, mask, sterile gowns, sterile gloves, sterile drape, hand hygiene and skin antiseptic. A timeout was performed prior to the initiation of the procedure. PROCEDURE: The operative field was prepped with chlorhexidine in a sterile fashion,  and a sterile drape was applied covering the operative field. A sterile gown and sterile gloves were used for the procedure. Local anesthesia was provided with 1% Lidocaine. Patient was positioned supine position on the CT gantry table. Scout CT was acquired for planning purposes. The patient was then prepped and draped in the usual sterile fashion. 1% lidocaine was used for local anesthesia. Using CT guidance, trocar needle was advanced into the fluid collection of the left abdomen. Modified Seldinger technique was then used to place a 12 [place] biliary drain with additional sideholes into the fluid collection. Fluid was aspirated  and sent for culture and total bilirubin level. Final image was acquired. Catheter was sutured in position and attached to bulb suction. Patient tolerated the procedure well and remained hemodynamically stable throughout. No complications were encountered and no significant blood loss. FINDINGS: Similar configuration of dumbbell shaped fluid within the left abdomen. Dark green bilious fluid was aspirated and a culture and T bili fluid were sent. IMPRESSION: Status post CT-guided drainage of left abdominal fluid collection. Signed, Dulcy Fanny. Dellia Nims, RPVI [facility] Electronically Signed   By: Corrie Mckusick D.O.   On: 03/10/2020 09:30        Scheduled Meds: . Chlorhexidine Gluconate Cloth  6 each Topical Daily  . enoxaparin (LOVENOX) injection  40 mg Subcutaneous Q24H  . mouth rinse  15 mL Mouth Rinse BID  . sodium chloride flush  5 mL Intracatheter Q8H   Continuous Infusions: . cefTRIAXone (ROCEPHIN)  IV 2 g (03/10/20 1759)  . lactated ringers 10 mL/hr at 03/10/20 2136  . metronidazole 500 mg (03/11/20 0502)     LOS: 4 days   Time spent: 35 mins Greater than 50% of this time was spent in counseling, explanation of diagnosis, planning of further management, and coordination of care.  I have personally reviewed and interpreted on  03/11/2020 daily labs, imagings as discussed above under date review session and assessment and plans.  I reviewed all nursing notes, pharmacy notes, consultant notes,  vitals, pertinent old records  I have discussed plan of care as described above with RN , patient on 03/11/2020  Voice Recognition /Dragon dictation system was used to create this note, attempts have been made to correct errors. Please contact the author with questions and/or clarifications.   Florencia Reasons, MD PhD FACP [facility]  Available via Epic secure chat 7am-7pm for nonurgent issues Please page for urgent issues To  page the attending provider between 7A-7P or the covering provider during after hours 7P-7A, please log into the web site [email or web address] and access using universal [place] password for that web site. If you do not have the password, please call the hospital operator.    03/11/2020, 8:56 AM

## 2020-03-11 NOTE — H&P (View-Only) (Signed)
Brandon Spence  Brandon Spence Mar 06, 1951  [id number].    Requesting MD: Florencia Reasons Chief Complaint/Reason for Consult: ?cholecystitis  HPI:  Brandon Spence is a 69yo male without known PMH who presented to [facility] 8/15 complaining of several days of weakness/fatigue, fever, and abdominal pain. States that over the last 6 weeks he has had intermittent LUQ abdominal pain. It became worse and more frequent x2-3 weeks, and 2 days prior to admission the pain was severe. Pain was associated with fatigue and fever, which he thought was due to sun poisoning. Denies ever having any RUQ pain, nausea, or vomiting. In the ED he was found to be septic, lactic acidosis 8.5, AKI Cr 1.98. CK 528. LFTs elevated AST 275, ALT 434, Alk phos 160, Tbili 6.8.  CT abdomen/pelvis revealed intra and extrahepatic biliary ductal dilatation with the common bile duct measuring up to 21 mm in diameter, moderate amount of fluid between the stomach and the liver, and possible lesion on the pancreatic head.  MRCP obtained and reports cholelithiasis, mild diffuse gallbladder wall thickening, no pericholecystic fluid or gallbladder distension; intrahepatic biliary ductal dilatation with diffuse periportal edema and dilated common bile duct 18m with suggestion of a 532mstone in the common bile duct; mild dilatation of the ventral pancreatic duct and no discrete pancreatic mass; lobulated loculated 7.2 x 2.8 x 12.0 cm fluid collection in the anterior left upper peritoneal cavity with mild wall thickening and enhancement. Blood culture found to be positive for klebsiella pneumoniae He was admitted to the ICU and started on broad spectrum antibiotics.  IR consulted and placed a drain in the left upper abdominal fluid collection, aspirated bile. GI took the patient for ERCP yesterday and found the entire main bile duct was markedly dilated, a biliary sphincterotomy was performed, the biliary tree was swept and nothing  was found. GI is questioning possible gallbladder perforation. General surgery asked to see. Patient states that he continues to have epigastric/LUQ abdominal pain. He tolerated a diet yesterday without nausea or vomiting. Denies any significant weight loss prior to admission. Labs pending today, but yesterday they were improved with normalized WBC 10.2, normalized Cr 0.78, and decreasing LFTs AST 30, ALT 93, Alk phos 78, Tbili 1.5.  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 Negative for weight loss.  Respiratory: Negative.   Cardiovascular: Negative.   Gastrointestinal: Positive for abdominal pain. Negative for nausea and vomiting.    All systems reviewed and otherwise negative except for as above  History reviewed. No pertinent family history.  History reviewed. No pertinent past medical history.  History reviewed. No pertinent surgical history.  Social History:  reports that he has never smoked. He has never used smokeless tobacco. He reports current alcohol use of about 1.0 standard drink of alcohol per week. He reports previous drug use.  Allergies: No Known Allergies  No medications prior to admission.    Prior to Admission medications   Not on File    Blood pressure 104/75, pulse 68, temperature 98.3 F (36.8 C), temperature source Oral, resp. rate 18, height 6' (1.829 m), weight 90.1 kg, SpO2 95 %. Physical Exam: General: pleasant, WD/WN white male who is laying in bed in NAD HEENT: head is normocephalic, atraumatic.  Sclera are noninjected.  PERRL.  Ears and nose without any masses or lesions.  Mouth  is dry. Poor dentition Heart: regular, rate, and rhythm.  Normal s1,s2. No obvious murmurs, gallops, or rubs noted.  Palpable pedal pulses bilaterally  Lungs: CTAB, no  wheezes, rhonchi, or rales noted.  Respiratory effort nonlabored Abd: incisions, soft, mild distension, +BS, no masses, hernias, or organomegaly. Significant epigastric and LUQ TTP with voluntary guarding, otherwise abdomen is nontender and there is no rigidity. LUQ drain with golden bilious vs serous drainage MS: no BUE/BLE edema, calves soft and nontender Skin: warm and dry with no masses, lesions, or rashes Psych: A&Ox4 with an appropriate affect Neuro: cranial nerves grossly intact, equal strength in BUE/BLE bilaterally, normal speech, thought process intact  Results for orders placed or performed during the hospital encounter of 03/07/20 (from the past 48 hour(s))  Aerobic/Anaerobic Culture (surgical/deep wound)     Status: None (Preliminary result)   Collection Time: 03/09/20  3:57 PM   Specimen: Abscess  Result Value Ref Range   Specimen Description ABSCESS ABDOMEN    Special Requests NONE    Gram Stain      RARE WBC PRESENT,BOTH PMN AND MONONUCLEAR NO ORGANISMS SEEN Performed at [facility], [address] [place], [place] [address]    Culture PENDING    Report Status PENDING   Hemoglobin A1c     Status: Abnormal   Collection Time: 03/10/20  1:11 AM  Result Value Ref Range   Hgb A1c MFr Bld 5.8 (H) 4.8 - 5.6 %    Comment: (Spence) Pre diabetes:          5.7%-6.4%  Diabetes:              >6.4%  Glycemic control for   <7.0% adults with diabetes    Mean Plasma Glucose 119.76 mg/dL    Comment: Performed at [facility] [address], [place], [place] [address]  CBC     Status: Abnormal   Collection Time: 03/10/20  7:49 AM  Result Value Ref Range   WBC 10.2 4.0 - 10.5 K/uL   RBC 3.37 (L) 4.22 - 5.81 MIL/uL   Hemoglobin 10.4 (L) 13.0 - 17.0 g/dL   HCT 30.0 (L) 39 - 52 %   MCV 89.0 80.0 - 100.0 fL   MCH 30.9 26.0 - 34.0 pg   MCHC 34.7 30.0 - 36.0 g/dL   RDW 13.3 11.5 - 15.5 %   Platelets 123 (L) 150 - 400 K/uL   nRBC 0.0 0.0 - 0.2 %    Comment: Performed  at [facility], [place] [address], [place], [place] [address]  Comprehensive metabolic panel     Status: Abnormal   Collection Time: 03/10/20  7:49 AM  Result Value Ref Range   Sodium 132 (L) 135 - 145 mmol/L   Potassium 3.3 (L) 3.5 - 5.1 mmol/L   Chloride 99 98 - 111 mmol/L   CO2 25 22 - 32 mmol/L   Glucose, Bld 130 (H) 70 - 99 mg/dL    Comment: Glucose reference range applies only to samples taken after fasting for at least 8 hours.   BUN 13 8 - 23 mg/dL   Creatinine, Ser 0.78 0.61 - 1.24 mg/dL   Calcium 7.6 (L) 8.9 - 10.3 mg/dL   Total Protein 5.3 (L) 6.5 - 8.1 g/dL   Albumin 2.1 (L) 3.5 - 5.0 g/dL   AST 30 15 - 41 U/L   ALT 93 (H) 0 - 44 U/L   Alkaline Phosphatase 78 38 - 126 U/L  Total Bilirubin 1.5 (H) 0.3 - 1.2 mg/dL   GFR calc non Af Amer >60 >60 mL/min   GFR calc Af Amer >60 >60 mL/min   Anion gap 8 5 - 15    Comment: Performed at [facility] [address], [place], [place] [address]   DG ERCP BILIARY & PANCREATIC DUCTS  Result Date: 03/11/2020 CLINICAL DATA:  Bile duct stone. EXAM: ERCP TECHNIQUE: Multiple spot images obtained with the fluoroscopic device and submitted for interpretation post-procedure. FLUOROSCOPY TIME:  Fluoroscopy Time:  2 minutes and 43 seconds Radiation Exposure Index (if provided by the fluoroscopic device): 31.84 mGy COMPARISON:  MRI 03/08/2020 FINDINGS: Retrograde cholangiogram demonstrates a dilated biliary system. Wire was advanced into a left hepatic duct. Questionable small filling defect in the distal common bile duct. Evidence for a balloon sweep. Percutaneous drain in the left upper abdomen. IMPRESSION: 1. Dilated biliary system. 2. Questionable filling defect in the distal common bile duct could represent a small stone. Evidence for balloon sweep. 3. Patent cystic duct. These images were submitted for radiologic interpretation only. Please see the procedural report for the amount of contrast and the fluoroscopy time utilized.  Electronically Signed   By: Markus Daft M.D.   On: 03/11/2020 08:17   CT IMAGE GUIDED DRAINAGE BY PERCUTANEOUS CATHETER  Result Date: 03/10/2020 INDICATION: 69 year old male with abdominal fluid collection. EXAM: CT GUIDED DRAINAGE OF ABDOMINAL ABSCESS MEDICATIONS: The patient is currently admitted to the hospital and receiving intravenous antibiotics. The antibiotics were administered within an appropriate time frame prior to the initiation of the procedure. ANESTHESIA/SEDATION: 1.0 mg IV Versed 50 mcg IV Fentanyl Moderate Sedation Time:  15 minutes The patient was continuously monitored during the procedure by the interventional radiology nurse under my direct supervision. COMPLICATIONS: None TECHNIQUE: Informed written consent was obtained from the patient after a thorough discussion of the procedural risks, benefits and alternatives. All questions were addressed. Maximal Sterile Barrier Technique was utilized including caps, mask, sterile gowns, sterile gloves, sterile drape, hand hygiene and skin antiseptic. A timeout was performed prior to the initiation of the procedure. PROCEDURE: The operative field was prepped with chlorhexidine in a sterile fashion, and a sterile drape was applied covering the operative field. A sterile gown and sterile gloves were used for the procedure. Local anesthesia was provided with 1% Lidocaine. Patient was positioned supine position on the CT gantry table. Scout CT was acquired for planning purposes. The patient was then prepped and draped in the usual sterile fashion. 1% lidocaine was used for local anesthesia. Using CT guidance, trocar needle was advanced into the fluid collection of the left abdomen. Modified Seldinger technique was then used to place a 12 [place] biliary drain with additional sideholes into the fluid collection. Fluid was aspirated and sent for culture and total bilirubin level. Final image was acquired. Catheter was sutured in position and attached to bulb  suction. Patient tolerated the procedure well and remained hemodynamically stable throughout. No complications were encountered and no significant blood loss. FINDINGS: Similar configuration of dumbbell shaped fluid within the left abdomen. Dark green bilious fluid was aspirated and a culture and T bili fluid were sent. IMPRESSION: Status post CT-guided drainage of left abdominal fluid collection. Signed, Dulcy Fanny. Dellia Nims, RPVI [facility] Radiology Electronically Signed   By: Corrie Mckusick D.O.   On: 03/10/2020 09:30   Anti-infectives (From admission, onward)   Start     Dose/Rate Route Frequency Ordered Stop  03/08/20 1400  vancomycin (VANCOREADY) IVPB 1250 mg/250 mL  Status:  Discontinued        1,250 mg 166.7 mL/hr over 90 Minutes Intravenous Every 24 hours 03/07/20 1320 03/07/20 1746   03/08/20 1300  vancomycin (VANCOREADY) IVPB 1250 mg/250 mL  Status:  Discontinued        1,250 mg 166.7 mL/hr over 90 Minutes Intravenous Every 24 hours 03/07/20 1309 03/07/20 1320   03/08/20 0100  ceFEPIme (MAXIPIME) 2 g in sodium chloride 0.9 % 100 mL IVPB  Status:  Discontinued        2 g 200 mL/hr over 30 Minutes Intravenous Every 12 hours 03/07/20 1309 03/07/20 1745   03/07/20 2130  metroNIDAZOLE (FLAGYL) IVPB 500 mg        500 mg 100 mL/hr over 60 Minutes Intravenous Every 8 hours 03/07/20 1744     03/07/20 1745  cefTRIAXone (ROCEPHIN) 2 g in sodium chloride 0.9 % 100 mL IVPB        2 g 200 mL/hr over 30 Minutes Intravenous Every 24 hours 03/07/20 1744     03/07/20 1315  vancomycin (VANCOREADY) IVPB 1500 mg/300 mL        1,500 mg 150 mL/hr over 120 Minutes Intravenous  Once 03/07/20 1309 03/07/20 1630   03/07/20 1300  ceFEPIme (MAXIPIME) 2 g in sodium chloride 0.9 % 100 mL IVPB        2 g 200 mL/hr over 30 Minutes Intravenous  Once 03/07/20 1254 03/07/20 1446   03/07/20 1300  metroNIDAZOLE (FLAGYL) IVPB 500 mg        500 mg 100 mL/hr over 60  Minutes Intravenous  Once 03/07/20 1254 03/07/20 1445   03/07/20 1300  vancomycin (VANCOCIN) IVPB 1000 mg/200 mL premix  Status:  Discontinued        1,000 mg 200 mL/hr over 60 Minutes Intravenous  Once 03/07/20 1254 03/07/20 1302        Assessment/Plan Sepsis/shock  Klebsiella pneumoniae bacteremia AKI  Cholelithiasis Elevated LFTs LUQ abdominal fluid collection - Several week history of worsening LUQ abdominal pain. Imaging revealed LUQ fluid collection that was drained by IR and found to contain bile. ERCP yesterday found the entire main bile duct was markedly dilated, stone likely recently passed through, a biliary sphincterotomy was performed, and the biliary tree was swept and nothing was found. LFTs down trending. General surgery asked to see for the possibility of gallbladder perforation. Unsure if this is the case as his gallbladder does not look perforated on recent CT or MRCP, but I'm also not sure where this fluid collection came from. Will repeat CT scan today. Can tentatively plan for cholecystectomy tomorrow depending on findings.  ID - rocephin/flagyl 8/15>> VTE - SCDs, lovenox FEN - IVF, reg diet, NPO after midnight Foley - none Follow up - TBD  Wellington [place], [facility] 03/11/2020, 10:18 AM Please see Amion for pager number during day hours 7:00am-4:30pm

## 2020-03-11 NOTE — Consult Note (Signed)
[facility] Surgery Consult Note  Brandon Spence Mar 06, 1951  [id number].    Requesting MD: Florencia Reasons Chief Complaint/Reason for Consult: ?cholecystitis  HPI:  Brandon Spence is a 70yo male without known PMH who presented to [facility] 8/15 complaining of several days of weakness/fatigue, fever, and abdominal pain. States that over the last 6 weeks he has had intermittent LUQ abdominal pain. It became worse and more frequent x2-3 weeks, and 2 days prior to admission the pain was severe. Pain was associated with fatigue and fever, which he thought was due to sun poisoning. Denies ever having any RUQ pain, nausea, or vomiting. In the ED he was found to be septic, lactic acidosis 8.5, AKI Cr 1.98. CK 528. LFTs elevated AST 275, ALT 434, Alk phos 160, Tbili 6.8.  CT abdomen/pelvis revealed intra and extrahepatic biliary ductal dilatation with the common bile duct measuring up to 21 mm in diameter, moderate amount of fluid between the stomach and the liver, and possible lesion on the pancreatic head.  MRCP obtained and reports cholelithiasis, mild diffuse gallbladder wall thickening, no pericholecystic fluid or gallbladder distension; intrahepatic biliary ductal dilatation with diffuse periportal edema and dilated common bile duct 18m with suggestion of a 532mstone in the common bile duct; mild dilatation of the ventral pancreatic duct and no discrete pancreatic mass; lobulated loculated 7.2 x 2.8 x 12.0 cm fluid collection in the anterior left upper peritoneal cavity with mild wall thickening and enhancement. Blood culture found to be positive for klebsiella pneumoniae He was admitted to the ICU and started on broad spectrum antibiotics.  IR consulted and placed a drain in the left upper abdominal fluid collection, aspirated bile. GI took the patient for ERCP yesterday and found the entire main bile duct was markedly dilated, a biliary sphincterotomy was performed, the biliary tree was swept and nothing  was found. GI is questioning possible gallbladder perforation. General surgery asked to see. Patient states that he continues to have epigastric/LUQ abdominal pain. He tolerated a diet yesterday without nausea or vomiting. Denies any significant weight loss prior to admission. Labs pending today, but yesterday they were improved with normalized WBC 10.2, normalized Cr 0.78, and decreasing LFTs AST 30, ALT 93, Alk phos 78, Tbili 1.5.  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 Negative for weight loss.  Respiratory: Negative.   Cardiovascular: Negative.   Gastrointestinal: Positive for abdominal pain. Negative for nausea and vomiting.    All systems reviewed and otherwise negative except for as above  History reviewed. No pertinent family history.  History reviewed. No pertinent past medical history.  History reviewed. No pertinent surgical history.  Social History:  reports that he has never smoked. He has never used smokeless tobacco. He reports current alcohol use of about 1.0 standard drink of alcohol per week. He reports previous drug use.  Allergies: No Known Allergies  No medications prior to admission.    Prior to Admission medications   Not on File    Blood pressure 104/75, pulse 68, temperature 98.3 F (36.8 C), temperature source Oral, resp. rate 18, height 6' (1.829 m), weight 90.1 kg, SpO2 95 %. Physical Exam: General: pleasant, WD/WN white male who is laying in bed in NAD HEENT: head is normocephalic, atraumatic.  Sclera are noninjected.  PERRL.  Ears and nose without any masses or lesions.  Mouth  is dry. Poor dentition Heart: regular, rate, and rhythm.  Normal s1,s2. No obvious murmurs, gallops, or rubs noted.  Palpable pedal pulses bilaterally  Lungs: CTAB, no  wheezes, rhonchi, or rales noted.  Respiratory effort nonlabored Abd: incisions, soft, mild distension, +BS, no masses, hernias, or organomegaly. Significant epigastric and LUQ TTP with voluntary guarding, otherwise abdomen is nontender and there is no rigidity. LUQ drain with golden bilious vs serous drainage MS: no BUE/BLE edema, calves soft and nontender Skin: warm and dry with no masses, lesions, or rashes Psych: A&Ox4 with an appropriate affect Neuro: cranial nerves grossly intact, equal strength in BUE/BLE bilaterally, normal speech, thought process intact  Results for orders placed or performed during the hospital encounter of 03/07/20 (from the past 48 hour(s))  Aerobic/Anaerobic Culture (surgical/deep wound)     Status: None (Preliminary result)   Collection Time: 03/09/20  3:57 PM   Specimen: Abscess  Result Value Ref Range   Specimen Description ABSCESS ABDOMEN    Special Requests NONE    Gram Stain      RARE WBC PRESENT,BOTH PMN AND MONONUCLEAR NO ORGANISMS SEEN Performed at [facility], [address] [place], [place] [address]    Culture PENDING    Report Status PENDING   Hemoglobin A1c     Status: Abnormal   Collection Time: 03/10/20  1:11 AM  Result Value Ref Range   Hgb A1c MFr Bld 5.8 (H) 4.8 - 5.6 %    Comment: (NOTE) Pre diabetes:          5.7%-6.4%  Diabetes:              >6.4%  Glycemic control for   <7.0% adults with diabetes    Mean Plasma Glucose 119.76 mg/dL    Comment: Performed at [facility] [address], [place], [place] [address]  CBC     Status: Abnormal   Collection Time: 03/10/20  7:49 AM  Result Value Ref Range   WBC 10.2 4.0 - 10.5 K/uL   RBC 3.37 (L) 4.22 - 5.81 MIL/uL   Hemoglobin 10.4 (L) 13.0 - 17.0 g/dL   HCT 30.0 (L) 39 - 52 %   MCV 89.0 80.0 - 100.0 fL   MCH 30.9 26.0 - 34.0 pg   MCHC 34.7 30.0 - 36.0 g/dL   RDW 13.3 11.5 - 15.5 %   Platelets 123 (L) 150 - 400 K/uL   nRBC 0.0 0.0 - 0.2 %    Comment: Performed  at [facility], [place] [address], [place], [place] [address]  Comprehensive metabolic panel     Status: Abnormal   Collection Time: 03/10/20  7:49 AM  Result Value Ref Range   Sodium 132 (L) 135 - 145 mmol/L   Potassium 3.3 (L) 3.5 - 5.1 mmol/L   Chloride 99 98 - 111 mmol/L   CO2 25 22 - 32 mmol/L   Glucose, Bld 130 (H) 70 - 99 mg/dL    Comment: Glucose reference range applies only to samples taken after fasting for at least 8 hours.   BUN 13 8 - 23 mg/dL   Creatinine, Ser 0.78 0.61 - 1.24 mg/dL   Calcium 7.6 (L) 8.9 - 10.3 mg/dL   Total Protein 5.3 (L) 6.5 - 8.1 g/dL   Albumin 2.1 (L) 3.5 - 5.0 g/dL   AST 30 15 - 41 U/L   ALT 93 (H) 0 - 44 U/L   Alkaline Phosphatase 78 38 - 126 U/L  Total Bilirubin 1.5 (H) 0.3 - 1.2 mg/dL   GFR calc non Af Amer >60 >60 mL/min   GFR calc Af Amer >60 >60 mL/min   Anion gap 8 5 - 15    Comment: Performed at [facility] [address], [place], [place] [address]   DG ERCP BILIARY & PANCREATIC DUCTS  Result Date: 03/11/2020 CLINICAL DATA:  Bile duct stone. EXAM: ERCP TECHNIQUE: Multiple spot images obtained with the fluoroscopic device and submitted for interpretation post-procedure. FLUOROSCOPY TIME:  Fluoroscopy Time:  2 minutes and 43 seconds Radiation Exposure Index (if provided by the fluoroscopic device): 31.84 mGy COMPARISON:  MRI 03/08/2020 FINDINGS: Retrograde cholangiogram demonstrates a dilated biliary system. Wire was advanced into a left hepatic duct. Questionable small filling defect in the distal common bile duct. Evidence for a balloon sweep. Percutaneous drain in the left upper abdomen. IMPRESSION: 1. Dilated biliary system. 2. Questionable filling defect in the distal common bile duct could represent a small stone. Evidence for balloon sweep. 3. Patent cystic duct. These images were submitted for radiologic interpretation only. Please see the procedural report for the amount of contrast and the fluoroscopy time utilized.  Electronically Signed   By: Markus Daft M.D.   On: 03/11/2020 08:17   CT IMAGE GUIDED DRAINAGE BY PERCUTANEOUS CATHETER  Result Date: 03/10/2020 INDICATION: 69 year old male with abdominal fluid collection. EXAM: CT GUIDED DRAINAGE OF ABDOMINAL ABSCESS MEDICATIONS: The patient is currently admitted to the hospital and receiving intravenous antibiotics. The antibiotics were administered within an appropriate time frame prior to the initiation of the procedure. ANESTHESIA/SEDATION: 1.0 mg IV Versed 50 mcg IV Fentanyl Moderate Sedation Time:  15 minutes The patient was continuously monitored during the procedure by the interventional radiology nurse under my direct supervision. COMPLICATIONS: None TECHNIQUE: Informed written consent was obtained from the patient after a thorough discussion of the procedural risks, benefits and alternatives. All questions were addressed. Maximal Sterile Barrier Technique was utilized including caps, mask, sterile gowns, sterile gloves, sterile drape, hand hygiene and skin antiseptic. A timeout was performed prior to the initiation of the procedure. PROCEDURE: The operative field was prepped with chlorhexidine in a sterile fashion, and a sterile drape was applied covering the operative field. A sterile gown and sterile gloves were used for the procedure. Local anesthesia was provided with 1% Lidocaine. Patient was positioned supine position on the CT gantry table. Scout CT was acquired for planning purposes. The patient was then prepped and draped in the usual sterile fashion. 1% lidocaine was used for local anesthesia. Using CT guidance, trocar needle was advanced into the fluid collection of the left abdomen. Modified Seldinger technique was then used to place a 12 [place] biliary drain with additional sideholes into the fluid collection. Fluid was aspirated and sent for culture and total bilirubin level. Final image was acquired. Catheter was sutured in position and attached to bulb  suction. Patient tolerated the procedure well and remained hemodynamically stable throughout. No complications were encountered and no significant blood loss. FINDINGS: Similar configuration of dumbbell shaped fluid within the left abdomen. Dark green bilious fluid was aspirated and a culture and T bili fluid were sent. IMPRESSION: Status post CT-guided drainage of left abdominal fluid collection. Signed, Dulcy Fanny. Dellia Nims, RPVI [facility] Radiology Electronically Signed   By: Corrie Mckusick D.O.   On: 03/10/2020 09:30   Anti-infectives (From admission, onward)   Start     Dose/Rate Route Frequency Ordered Stop  03/08/20 1400  vancomycin (VANCOREADY) IVPB 1250 mg/250 mL  Status:  Discontinued        1,250 mg 166.7 mL/hr over 90 Minutes Intravenous Every 24 hours 03/07/20 1320 03/07/20 1746   03/08/20 1300  vancomycin (VANCOREADY) IVPB 1250 mg/250 mL  Status:  Discontinued        1,250 mg 166.7 mL/hr over 90 Minutes Intravenous Every 24 hours 03/07/20 1309 03/07/20 1320   03/08/20 0100  ceFEPIme (MAXIPIME) 2 g in sodium chloride 0.9 % 100 mL IVPB  Status:  Discontinued        2 g 200 mL/hr over 30 Minutes Intravenous Every 12 hours 03/07/20 1309 03/07/20 1745   03/07/20 2130  metroNIDAZOLE (FLAGYL) IVPB 500 mg        500 mg 100 mL/hr over 60 Minutes Intravenous Every 8 hours 03/07/20 1744     03/07/20 1745  cefTRIAXone (ROCEPHIN) 2 g in sodium chloride 0.9 % 100 mL IVPB        2 g 200 mL/hr over 30 Minutes Intravenous Every 24 hours 03/07/20 1744     03/07/20 1315  vancomycin (VANCOREADY) IVPB 1500 mg/300 mL        1,500 mg 150 mL/hr over 120 Minutes Intravenous  Once 03/07/20 1309 03/07/20 1630   03/07/20 1300  ceFEPIme (MAXIPIME) 2 g in sodium chloride 0.9 % 100 mL IVPB        2 g 200 mL/hr over 30 Minutes Intravenous  Once 03/07/20 1254 03/07/20 1446   03/07/20 1300  metroNIDAZOLE (FLAGYL) IVPB 500 mg        500 mg 100 mL/hr over 60  Minutes Intravenous  Once 03/07/20 1254 03/07/20 1445   03/07/20 1300  vancomycin (VANCOCIN) IVPB 1000 mg/200 mL premix  Status:  Discontinued        1,000 mg 200 mL/hr over 60 Minutes Intravenous  Once 03/07/20 1254 03/07/20 1302        Assessment/Plan Sepsis/shock  Klebsiella pneumoniae bacteremia AKI  Cholelithiasis Elevated LFTs LUQ abdominal fluid collection - Several week history of worsening LUQ abdominal pain. Imaging revealed LUQ fluid collection that was drained by IR and found to contain bile. ERCP yesterday found the entire main bile duct was markedly dilated, stone likely recently passed through, a biliary sphincterotomy was performed, and the biliary tree was swept and nothing was found. LFTs down trending. General surgery asked to see for the possibility of gallbladder perforation. Unsure if this is the case as his gallbladder does not look perforated on recent CT or MRCP, but I'm also not sure where this fluid collection came from. Will repeat CT scan today. Can tentatively plan for cholecystectomy tomorrow depending on findings.  ID - rocephin/flagyl 8/15>> VTE - SCDs, lovenox FEN - IVF, reg diet, NPO after midnight Foley - none Follow up - TBD  Wellington [place], [facility] 03/11/2020, 10:18 AM Please see Amion for pager number during day hours 7:00am-4:30pm

## 2020-03-12 ENCOUNTER — Encounter (HOSPITAL_COMMUNITY): Payer: Self-pay | Admitting: Gastroenterology

## 2020-03-12 ENCOUNTER — Inpatient Hospital Stay (HOSPITAL_COMMUNITY): Payer: Self-pay | Admitting: Registered Nurse

## 2020-03-12 ENCOUNTER — Encounter (HOSPITAL_COMMUNITY)
Admission: EM | Disposition: A | Discharge status: Home / Self Care | Payer: Self-pay | Source: Home / Self Care | Attending: Internal Medicine

## 2020-03-12 HISTORY — PX: IRRIGATION AND DEBRIDEMENT ABSCESS: SHX5252

## 2020-03-12 HISTORY — PX: CHOLECYSTECTOMY: SHX55

## 2020-03-12 LAB — BASIC METABOLIC PANEL
Anion gap: 10 (ref 5–15)
BUN: 12 mg/dL (ref 8–23)
CO2: 24 mmol/L (ref 22–32)
Calcium: 7.2 mg/dL — ABNORMAL LOW (ref 8.9–10.3)
Chloride: 97 mmol/L — ABNORMAL LOW (ref 98–111)
Creatinine, Ser: 0.76 mg/dL (ref 0.61–1.24)
GFR calc Af Amer: >60 mL/min (ref >60)
GFR calc non Af Amer: >60 mL/min (ref >60)
Glucose, Bld: 91 mg/dL (ref 70–99)
Potassium: 4.1 mmol/L (ref 3.5–5.1)
Sodium: 131 mmol/L — ABNORMAL LOW (ref 135–145)

## 2020-03-12 LAB — LIPASE, BLOOD: Lipase: 87 U/L — ABNORMAL HIGH (ref 11–51)

## 2020-03-12 LAB — MAGNESIUM: Magnesium: 1.4 mg/dL — ABNORMAL LOW (ref 1.7–2.4)

## 2020-03-12 SURGERY — LAPAROSCOPIC CHOLECYSTECTOMY
Anesthesia: General | Site: Abdomen

## 2020-03-12 MED ORDER — HEMOSTATIC AGENTS (NO CHARGE) OPTIME
TOPICAL | Status: DC | PRN
  Administered 2020-03-12 (×2): 1 via TOPICAL

## 2020-03-12 MED ORDER — CEFAZOLIN SODIUM-DEXTROSE 2-3 GM-%(50ML) IV SOLR
INTRAVENOUS | Status: DC | PRN
  Administered 2020-03-12: 2 g via INTRAVENOUS

## 2020-03-12 MED ORDER — OXYCODONE HCL 5 MG PO TABS: 5.0000 mg | ORAL_TABLET | Freq: Once | ORAL | Status: DC | PRN

## 2020-03-12 MED ORDER — SUGAMMADEX SODIUM 200 MG/2ML IV SOLN
INTRAVENOUS | Status: DC | PRN
  Administered 2020-03-12 (×2): 100 mg via INTRAVENOUS

## 2020-03-12 MED ORDER — ROCURONIUM BROMIDE 10 MG/ML (PF) SYRINGE
PREFILLED_SYRINGE | INTRAVENOUS | Status: AC
  Filled 2020-03-12: qty 10

## 2020-03-12 MED ORDER — ACETAMINOPHEN 325 MG PO TABS
650.0000 mg | ORAL_TABLET | Freq: Four times a day (QID) | ORAL | Status: DC
  Administered 2020-03-12 – 2020-03-13 (×4): 650 mg via ORAL
  Filled 2020-03-12 (×6): qty 2

## 2020-03-12 MED ORDER — ONDANSETRON HCL 4 MG/2ML IJ SOLN
INTRAMUSCULAR | Status: AC
  Filled 2020-03-12: qty 2

## 2020-03-12 MED ORDER — PHENYLEPHRINE 40 MCG/ML (10ML) SYRINGE FOR IV PUSH (FOR BLOOD PRESSURE SUPPORT)
PREFILLED_SYRINGE | INTRAVENOUS | Status: DC | PRN
  Administered 2020-03-12: 80 ug via INTRAVENOUS
  Administered 2020-03-12: 60 ug via INTRAVENOUS
  Administered 2020-03-12 (×2): 80 ug via INTRAVENOUS

## 2020-03-12 MED ORDER — PROPOFOL 10 MG/ML IV BOLUS
INTRAVENOUS | Status: AC
  Filled 2020-03-12: qty 20

## 2020-03-12 MED ORDER — MAGNESIUM SULFATE 2 GM/50ML IV SOLN
2.0000 g | Freq: Once | INTRAVENOUS | Status: AC
  Administered 2020-03-12: 2 g via INTRAVENOUS
  Filled 2020-03-12: qty 50

## 2020-03-12 MED ORDER — ROCURONIUM BROMIDE 10 MG/ML (PF) SYRINGE
PREFILLED_SYRINGE | INTRAVENOUS | Status: DC | PRN
  Administered 2020-03-12: 20 mg via INTRAVENOUS
  Administered 2020-03-12: 60 mg via INTRAVENOUS

## 2020-03-12 MED ORDER — ONDANSETRON HCL 4 MG/2ML IJ SOLN
INTRAMUSCULAR | Status: DC | PRN
  Administered 2020-03-12: 4 mg via INTRAVENOUS

## 2020-03-12 MED ORDER — BUPIVACAINE HCL (PF) 0.25 % IJ SOLN
INTRAMUSCULAR | Status: DC | PRN
  Administered 2020-03-12: 12 mL

## 2020-03-12 MED ORDER — BUPIVACAINE HCL (PF) 0.25 % IJ SOLN
INTRAMUSCULAR | Status: AC
  Filled 2020-03-12: qty 30

## 2020-03-12 MED ORDER — OXYCODONE HCL 5 MG/5ML PO SOLN: 5.0000 mg | Freq: Once | ORAL | Status: DC | PRN

## 2020-03-12 MED ORDER — LIDOCAINE 2% (20 MG/ML) 5 ML SYRINGE
INTRAMUSCULAR | Status: AC
  Filled 2020-03-12: qty 5

## 2020-03-12 MED ORDER — ONDANSETRON HCL 4 MG/2ML IJ SOLN: 4.0000 mg | Freq: Once | INTRAMUSCULAR | Status: DC | PRN

## 2020-03-12 MED ORDER — CEFAZOLIN SODIUM 1 G IJ SOLR
INTRAMUSCULAR | Status: AC
  Filled 2020-03-12: qty 20

## 2020-03-12 MED ORDER — PANTOPRAZOLE SODIUM 40 MG IV SOLR
40.0000 mg | INTRAVENOUS | Status: DC
  Administered 2020-03-12 – 2020-03-14 (×3): 40 mg via INTRAVENOUS
  Filled 2020-03-12 (×3): qty 40

## 2020-03-12 MED ORDER — DEXAMETHASONE SODIUM PHOSPHATE 10 MG/ML IJ SOLN
INTRAMUSCULAR | Status: DC | PRN
  Administered 2020-03-12: 5 mg via INTRAVENOUS

## 2020-03-12 MED ORDER — FENTANYL CITRATE (PF) 250 MCG/5ML IJ SOLN
INTRAMUSCULAR | Status: AC
  Filled 2020-03-12: qty 5

## 2020-03-12 MED ORDER — FENTANYL CITRATE (PF) 100 MCG/2ML IJ SOLN: 25.0000 ug | INTRAMUSCULAR | Status: DC | PRN

## 2020-03-12 MED ORDER — ACETAMINOPHEN 10 MG/ML IV SOLN: 1000.0000 mg | Freq: Once | INTRAVENOUS | Status: DC | PRN

## 2020-03-12 MED ORDER — DEXAMETHASONE SODIUM PHOSPHATE 10 MG/ML IJ SOLN
INTRAMUSCULAR | Status: AC
  Filled 2020-03-12: qty 1

## 2020-03-12 MED ORDER — 0.9 % SODIUM CHLORIDE (POUR BTL) OPTIME
TOPICAL | Status: DC | PRN
  Administered 2020-03-12: 1000 mL

## 2020-03-12 MED ORDER — ACETAMINOPHEN 160 MG/5ML PO SOLN: 1000.0000 mg | Freq: Once | ORAL | Status: DC | PRN

## 2020-03-12 MED ORDER — PROPOFOL 10 MG/ML IV BOLUS
INTRAVENOUS | Status: DC | PRN
  Administered 2020-03-12: 140 mg via INTRAVENOUS

## 2020-03-12 MED ORDER — ACETAMINOPHEN 500 MG PO TABS: 1000.0000 mg | ORAL_TABLET | Freq: Once | ORAL | Status: DC | PRN

## 2020-03-12 MED ORDER — LIDOCAINE 2% (20 MG/ML) 5 ML SYRINGE
INTRAMUSCULAR | Status: DC | PRN
  Administered 2020-03-12: 60 mg via INTRAVENOUS

## 2020-03-12 MED ORDER — SODIUM CHLORIDE 0.9 % IR SOLN
Status: DC | PRN
  Administered 2020-03-12: 1000 mL

## 2020-03-12 MED ORDER — PHENYLEPHRINE 40 MCG/ML (10ML) SYRINGE FOR IV PUSH (FOR BLOOD PRESSURE SUPPORT)
PREFILLED_SYRINGE | INTRAVENOUS | Status: AC
  Filled 2020-03-12: qty 10

## 2020-03-12 MED ORDER — FENTANYL CITRATE (PF) 250 MCG/5ML IJ SOLN
INTRAMUSCULAR | Status: DC | PRN
Start: 2020-03-12 — End: 2020-03-12
  Administered 2020-03-12: 100 ug via INTRAVENOUS

## 2020-03-12 MED ORDER — LACTATED RINGERS IV SOLN: INTRAVENOUS | Status: DC | PRN

## 2020-03-12 SURGICAL SUPPLY — 41 items
APPLIER CLIP 5 13 M/L LIGAMAX5 (MISCELLANEOUS) ×3
BLADE CLIPPER SURG (BLADE) IMPLANT
CANISTER SUCT 3000ML PPV (MISCELLANEOUS) ×3 IMPLANT
CHLORAPREP W/TINT 26 (MISCELLANEOUS) ×3 IMPLANT
CLIP APPLIE 5 13 M/L LIGAMAX5 (MISCELLANEOUS) ×1 IMPLANT
COVER SURGICAL LIGHT HANDLE (MISCELLANEOUS) ×3 IMPLANT
COVER WAND RF STERILE (DRAPES) ×1 IMPLANT
DERMABOND ADVANCED (GAUZE/BANDAGES/DRESSINGS) ×2
DERMABOND ADVANCED .7 DNX12 (GAUZE/BANDAGES/DRESSINGS) ×1 IMPLANT
DRAIN CHANNEL 19F RND (DRAIN) ×4 IMPLANT
DRAIN JP 10F RND SILICONE (MISCELLANEOUS) ×2 IMPLANT
ELECT REM PT RETURN 9FT ADLT (ELECTROSURGICAL) ×3
ELECTRODE REM PT RTRN 9FT ADLT (ELECTROSURGICAL) ×1 IMPLANT
ENDOLOOP SUT PDS II  0 18 (SUTURE) ×4
ENDOLOOP SUT PDS II 0 18 (SUTURE) IMPLANT
GLOVE BIO SURGEON STRL SZ 6 (GLOVE) ×3 IMPLANT
GLOVE INDICATOR 6.5 STRL GRN (GLOVE) ×3 IMPLANT
GOWN STRL REUS W/ TWL LRG LVL3 (GOWN DISPOSABLE) ×3 IMPLANT
GOWN STRL REUS W/TWL LRG LVL3 (GOWN DISPOSABLE) ×6
GRASPER SUT TROCAR 14GX15 (MISCELLANEOUS) ×3 IMPLANT
HEMOSTAT SNOW SURGICEL 2X4 (HEMOSTASIS) ×4 IMPLANT
KIT BASIN OR (CUSTOM PROCEDURE TRAY) ×3 IMPLANT
KIT TURNOVER KIT B (KITS) ×3 IMPLANT
NDL INSUFFLATION 14GA 120MM (NEEDLE) ×1 IMPLANT
NEEDLE INSUFFLATION 14GA 120MM (NEEDLE) ×3 IMPLANT
NS IRRIG 1000ML POUR BTL (IV SOLUTION) ×3 IMPLANT
PAD ARMBOARD 7.5X6 YLW CONV (MISCELLANEOUS) ×3 IMPLANT
POUCH SPECIMEN RETRIEVAL 10MM (ENDOMECHANICALS) ×5 IMPLANT
SCISSORS LAP 5X35 DISP (ENDOMECHANICALS) ×3 IMPLANT
SET IRRIG TUBING LAPAROSCOPIC (IRRIGATION / IRRIGATOR) ×3 IMPLANT
SET TUBE SMOKE EVAC HIGH FLOW (TUBING) ×3 IMPLANT
SLEEVE ENDOPATH XCEL 5M (ENDOMECHANICALS) ×8 IMPLANT
SPECIMEN JAR SMALL (MISCELLANEOUS) ×3 IMPLANT
SUT ETHILON 2 0 FS 18 (SUTURE) ×2 IMPLANT
SUT MNCRL AB 4-0 PS2 18 (SUTURE) ×3 IMPLANT
TOWEL GREEN STERILE (TOWEL DISPOSABLE) IMPLANT
TOWEL GREEN STERILE FF (TOWEL DISPOSABLE) ×3 IMPLANT
TRAY LAPAROSCOPIC MC (CUSTOM PROCEDURE TRAY) ×3 IMPLANT
TROCAR XCEL NON-BLD 11X100MML (ENDOMECHANICALS) ×3 IMPLANT
TROCAR XCEL NON-BLD 5MMX100MML (ENDOMECHANICALS) ×3 IMPLANT
WATER STERILE IRR 1000ML POUR (IV SOLUTION) ×3 IMPLANT

## 2020-03-12 NOTE — Interval H&P Note (Signed)
History and Physical Interval Note:  03/12/2020 6:52 AM  Brandon Spence  has presented today for surgery, with the diagnosis of cholelithiasis.  The various methods of treatment have been discussed with the patient and family. After consideration of risks, benefits and other options for treatment, the patient has consented to  Procedure(s): LAPAROSCOPIC CHOLECYSTECTOMY (N/A) as a surgical intervention.  The patient's history has been reviewed, patient examined, no change in status, stable for surgery.  I have reviewed the patient's chart and labs.  Questions were answered to the patient's satisfaction.     Brandon Spence

## 2020-03-12 NOTE — Discharge Instructions (Signed)
[facility]  Please arrive at least 30 min before your appointment to complete your check in paperwork.  If you are unable to arrive 30 min prior to your appointment time we may have to cancel or reschedule you. LAPAROSCOPIC SURGERY: POST OP INSTRUCTIONS Always review your discharge instruction sheet given to you by the facility where your surgery was performed. IF YOU HAVE DISABILITY OR FAMILY LEAVE FORMS, YOU MUST BRING THEM TO THE OFFICE FOR PROCESSING.   DO NOT GIVE THEM TO YOUR DOCTOR.  PAIN CONTROL  1. First take acetaminophen (Tylenol) AND/or ibuprofen (Advil) to control your pain after surgery.  Follow directions on package.  Taking acetaminophen (Tylenol) and/or ibuprofen (Advil) regularly after surgery will help to control your pain and lower the amount of prescription pain medication you may need.  You should not take more than 4,000 mg (4 grams) of acetaminophen (Tylenol) in 24 hours.  You should not take ibuprofen (Advil), aleve, motrin, naprosyn or other NSAIDS if you have a history of stomach ulcers or chronic kidney disease.  2. A prescription for pain medication may be given to you upon discharge.  Take your pain medication as prescribed, if you still have uncontrolled pain after taking acetaminophen (Tylenol) or ibuprofen (Advil). 3. Use ice packs to help control pain. 4. If you need a refill on your pain medication, please contact your pharmacy.  They will contact our office to request authorization. Prescriptions will not be filled after 5pm or on week-ends.  HOME MEDICATIONS 5. Take your usually prescribed medications unless otherwise directed.  DIET 6. You should follow a light diet the first few days after arrival home.  Be sure to include lots of fluids daily. Avoid fatty, fried foods.   CONSTIPATION 7. It is common to experience some constipation after surgery and if you are taking pain medication.  Increasing fluid intake and taking a stool  softener (such as Colace) will usually help or prevent this problem from occurring.  A mild laxative (Milk of Magnesia or Miralax) should be taken according to package instructions if there are no bowel movements after 48 hours.  WOUND/INCISION CARE 8. Most patients will experience some swelling and bruising in the area of the incisions.  Ice packs will help.  Swelling and bruising can take several days to resolve.  9. Unless discharge instructions indicate otherwise, follow guidelines below  a. STERI-STRIPS - you may remove your outer bandages 48 hours after surgery, and you may shower at that time.  You have steri-strips (small skin tapes) in place directly over the incision.  These strips should be left on the skin for 7-10 days.   b. DERMABOND/SKIN GLUE - you may shower in 24 hours.  The glue will flake off over the next 2-3 weeks. 10. Any sutures or staples will be removed at the office during your follow-up visit.  ACTIVITIES 11. You may resume regular (light) daily activities beginning the next day--such as daily self-care, walking, climbing stairs--gradually increasing activities as tolerated.  You may have sexual intercourse when it is comfortable.  Refrain from any heavy lifting or straining until approved by your doctor. a. You may drive when you are no longer taking prescription pain medication, you can comfortably wear a seatbelt, and you can safely maneuver your car and apply brakes.  FOLLOW-UP 12. You should see your doctor in the office for a follow-up appointment approximately 2-3 weeks after your surgery.  You should have been given your post-op/follow-up appointment when   your surgery was scheduled.  If you did not receive a post-op/follow-up appointment, make sure that you call for this appointment within a day or two after you arrive home to insure a convenient appointment time.   WHEN TO CALL YOUR DOCTOR: 1. Fever over 101.0 2. Inability to urinate 3. Continued bleeding from  incision. 4. Increased pain, redness, or drainage from the incision. 5. Increasing abdominal pain  The clinic staff is available to answer your questions during regular business hours.  Please don't hesitate to call and ask to speak to one of the nurses for clinical concerns.  If you have a medical emergency, go to the nearest emergency room or call 911.  A surgeon from [facility] is always on call at the hospital. [address], [address], [place], [place]  [address] ? [address] [address], [place], [place]   [address] [phone] ? [phone] ? FAX [phone]  .........   Managing Your Pain After Surgery Without Opioids    Thank you for participating in our program to help patients manage their pain after surgery without opioids. This is part of our effort to provide you with the best care possible, without exposing you or your family to the risk that opioids pose.  What pain can I expect after surgery? You can expect to have some pain after surgery. This is normal. The pain is typically worse the day after surgery, and quickly begins to get better. Many studies have found that many patients are able to manage their pain after surgery with Over-the-Counter (OTC) medications such as Tylenol and Motrin. If you have a condition that does not allow you to take Tylenol or Motrin, notify your surgical team.  How will I manage my pain? The best strategy for controlling your pain after surgery is around the clock pain control with Tylenol (acetaminophen) and Motrin (ibuprofen or Advil). Alternating these medications with each other allows you to maximize your pain control. In addition to Tylenol and Motrin, you can use heating pads or ice packs on your incisions to help reduce your pain.  How will I alternate your regular strength over-the-counter pain medication? You will take a dose of pain medication every three hours. ; Start by taking 650 mg of Tylenol (2 pills of 325  mg) ; 3 hours later take 600 mg of Motrin (3 pills of 200 mg) ; 3 hours after taking the Motrin take 650 mg of Tylenol ; 3 hours after that take 600 mg of Motrin.   - 1 -  See example - if your first dose of Tylenol is at 12:00 PM   12:00 PM Tylenol 650 mg (2 pills of 325 mg)  3:00 PM Motrin 600 mg (3 pills of 200 mg)  6:00 PM Tylenol 650 mg (2 pills of 325 mg)  9:00 PM Motrin 600 mg (3 pills of 200 mg)  Continue alternating every 3 hours   We recommend that you follow this schedule around-the-clock for at least 3 days after surgery, or until you feel that it is no longer needed. Use the table on the last page of this handout to keep track of the medications you are taking. Important: Do not take more than 3000mg of Tylenol or 3200mg of Motrin in a 24-hour period. Do not take ibuprofen/Motrin if you have a history of bleeding stomach ulcers, severe kidney disease, &/or actively taking a blood thinner  What if I still have pain? If you have pain that is not   controlled with the over-the-counter pain medications (Tylenol and Motrin or Advil) you might have what we call "breakthrough" pain. You will receive a prescription for a small amount of an opioid pain medication such as Oxycodone, Tramadol, or Tylenol with Codeine. Use these opioid pills in the first 24 hours after surgery if you have breakthrough pain. Do not take more than 1 pill every 4-6 hours.  If you still have uncontrolled pain after using all opioid pills, don't hesitate to call our staff using the number provided. We will help make sure you are managing your pain in the best way possible, and if necessary, we can provide a prescription for additional pain medication.   Day 1    Time  Name of Medication Number of pills taken  Amount of Acetaminophen  Pain Level   Comments  AM PM       AM PM       AM PM       AM PM       AM PM       AM PM       AM PM       AM PM       Total Daily amount of Acetaminophen Do not  take more than  3,000 mg per day      Day 2    Time  Name of Medication Number of pills taken  Amount of Acetaminophen  Pain Level   Comments  AM PM       AM PM       AM PM       AM PM       AM PM       AM PM       AM PM       AM PM       Total Daily amount of Acetaminophen Do not take more than  3,000 mg per day      Day 3    Time  Name of Medication Number of pills taken  Amount of Acetaminophen  Pain Level   Comments  AM PM       AM PM       AM PM       AM PM          AM PM       AM PM       AM PM       AM PM       Total Daily amount of Acetaminophen Do not take more than  3,000 mg per day      Day 4    Time  Name of Medication Number of pills taken  Amount of Acetaminophen  Pain Level   Comments  AM PM       AM PM       AM PM       AM PM       AM PM       AM PM       AM PM       AM PM       Total Daily amount of Acetaminophen Do not take more than  3,000 mg per day      Day 5    Time  Name of Medication Number of pills taken  Amount of Acetaminophen  Pain Level   Comments  AM PM       AM PM       AM   PM       AM PM       AM PM       AM PM       AM PM       AM PM       Total Daily amount of Acetaminophen Do not take more than  3,000 mg per day       Day 6    Time  Name of Medication Number of pills taken  Amount of Acetaminophen  Pain Level  Comments  AM PM       AM PM       AM PM       AM PM       AM PM       AM PM       AM PM       AM PM       Total Daily amount of Acetaminophen Do not take more than  3,000 mg per day      Day 7    Time  Name of Medication Number of pills taken  Amount of Acetaminophen  Pain Level   Comments  AM PM       AM PM       AM PM       AM PM       AM PM       AM PM       AM PM       AM PM       Total Daily amount of Acetaminophen Do not take more than  3,000 mg per day        For additional information about how and where to safely dispose of unused  opioid medications - https://www.morepowerfulnc.org  Disclaimer: This document contains information and/or instructional materials adapted from [facility] for the typical patient with your condition. It does not replace medical advice from your health care provider because your experience may differ from that of the typical patient. Talk to your health care provider if you have any questions about this document, your condition or your treatment plan. Adapted from [facility]   

## 2020-03-12 NOTE — Transfer of Care (Signed)
Immediate Anesthesia Transfer of Care Note  Patient: Brandon Spence  Procedure(s) Performed: LAPAROSCOPIC CHOLECYSTECTOMY (N/A Abdomen) IRRIGATION AND DEBRIDEMENT ABDOMINAL ABSCESS (N/A Abdomen)  Patient Location: PACU  Anesthesia Type:General  Level of Consciousness: awake and drowsy  Airway & Oxygen Therapy: Patient Spontanous Breathing and Patient connected to nasal cannula oxygen  Post-op Assessment: Report given to RN and Post -op Vital signs reviewed and stable  Post vital signs: Reviewed and stable  Last Vitals:  Vitals Value Taken Time  BP 106/67 03/12/20 0936  Temp    Pulse 72 03/12/20 0936  Resp 21 03/12/20 0936  SpO2 98 % 03/12/20 0936  Vitals shown include unvalidated device data.  Last Pain:  Vitals:   03/12/20 0501  TempSrc: Oral  PainSc:       Patients Stated Pain Goal: 3 (03/12/20 0214)  Complications: No complications documented.

## 2020-03-12 NOTE — Anesthesia Procedure Notes (Signed)
Procedure Name: Intubation Date/Time: 03/12/2020 7:42 AM Performed by: Trinna Post., CRNA Pre-anesthesia Checklist: Emergency Drugs available, Suction available, Patient identified, Patient being monitored and Timeout performed Patient Re-evaluated:Patient Re-evaluated prior to induction Oxygen Delivery Method: Circle system utilized Preoxygenation: Pre-oxygenation with 100% oxygen Induction Type: IV induction Ventilation: Mask ventilation without difficulty Laryngoscope Size: Mac and 4 Grade View: Grade I Tube type: Oral Tube size: 7.5 mm Number of attempts: 1 Airway Equipment and Method: Stylet Placement Confirmation: ETT inserted through vocal cords under direct vision,  positive ETCO2 and breath sounds checked- equal and bilateral Secured at: 22 cm Tube secured with: Tape Dental Injury: Teeth and Oropharynx as per pre-operative assessment

## 2020-03-12 NOTE — Progress Notes (Signed)
PROGRESS NOTE    Brandon Spence  OTL:[id number] DOB: Feb 02, 1951 DOA: 03/07/2020 PCP: Patient, No Pcp Per    Chief Complaint  Patient presents with  . Dehydration    Brief Narrative:  Brandon Spence is a 69 year old male without known PMH who presents to ED 8/15 with dark urine following progressive feelings of general unwellness, weakness, fatigue x 3 days after prolonged sun exposure. He also reported severe cramping abdominal pain that started on 8/14. The pain was diffuse and intermittent.   In ED, met sepsis criteria and was resuscitated with 30cc/kg, started on empiric abx. Initial labs reveal myriad of metabolic derrangements including Na 123 K 3.3 Co2 17 Cr 1.98 AG 24 Alk phos 160 AST 275 ALT 434 Tbili 6.8. On exam he was jaundiced and sent for CT abdomen pelvis which revealed intra and extrahepatic biliary ductal dilation, CBD measuring 65m. There is a hypoecoic lesion in the pancreatic head measuring 2.8 x 1.2cm.   Patient is complaining of fatigue, belching and some abdominal pain. Blood pressure remained 80/60 despite fluid resuscitation.  He is started on pressors and admitted to ICU.  Significant Diagnostic Tests:  8/15 CT a/p noncon> intra and extrahepatic biliary ductal dilatation, common bile duct 240mwithout definite obstructing lesion. Pancreatic head with hypoechoic lesion 2.8cm x 1.2 cm (mass vs dilated portion of main pancreatic duct?) Moderate amount of fluid between stomach and liver.   Micro Data:  8/15 SARS Cov2 neg 8/15 BCx> 8/15 UA>  Antimicrobials:  8/15 vanc 8/15 cefepime  8/15 Flagyl>> 8/15 ceftriaxone>>   Subjective: Patient is seen after returned from OR, report feeling much better, left-sided pain has resolved He has a JP drain on right side with serosanguineous drainage  Assessment & Plan:   Active Problems:   Pressure injury of skin   Abscess of abdominal cavity (HCC)   Hyperbilirubinemia   Dilated bile duct   Shock likely due  to hypovolemia and septic shock from biliary abdomen source, present on admission -Is started on pressors and admitted to the ICU initially on admission , he is off pressor, transferred out of ICU to hospitalist service on August 19 -Blood culture positive for Klebsiella pneumoniae, abdominal drain culture no growth, SARS-CoV-2 screening negative, MRSA screening negative -Continue Rocephin and Flagyl for now, wbc trending down towards normal  Gallstones /biliary ductal dilatation/possible CBD stone /abdominal fluid collection/lft elevation -Status post IR drain on August 18 -Status post ERCP on August 18, no CBD stone found, The ampulla had a small ulcer, possibly due to passed CBD stone.  Status post sphincterotomy -lft normalized today -Patient continues have significant left-sided abdominal pain, repeat ct ab/pel on 8/19 with persistent fluid collection, persistent intrahepatic biliary ductal dilatation. -s/p Laparoscopic cholecystectomy, debridement of intra-abdominal abscess and drain placement on 8/20 -Management per general surgery, duration of antibiotic per general surgery  AKI -BUN 43/creatinine 1.98 on presentation -Likely from dehydration and sepsis, CK also mildly elevated on presentation 528, CK trending down on repeat -Renal function normalized since August 18  Normocytic anemia -Hemoglobin 11.8, no sign of bleeding -Monitor  Hyponatremia -Sodium 123 on presentation -Likely from dehydration, he did report drink 1 can of beer daily -Improving, sodium 132 today   Hypokalemia, replaced normalized Hypomagnesemia, give IV mag today  Thrombocytopenia , likely reactive due to sepsis nadir at 129, platelet normalized on August 19  Elevated fasting blood glucose, possibly stress-induced A1c 5.8 A.m. blood glucose 91 this morning   DVT prophylaxis: enoxaparin (LOVENOX) injection 40  mg Start: 03/10/20 1100 SCDs Start: 03/07/20 1645   Code Status: DNR Family  Communication: Patient Disposition:   Status is: Inpatient   Dispo: The patient is from: Home              Anticipated d/c is to: Home              Anticipated d/c date is: To be determined             Needs general surgery clearance  Consultants:   Critical care admission, transfer to hospitalist on August 19  GI  General surgery  IR  Procedures:   IR drain placement on August 18  ERCP with sphincterotomy  on August 18 s/p Laparoscopic cholecystectomy, debridement of intra-abdominal abscess and drain placement on 8/20  Antimicrobials:   8/15 vanc 8/15 cefepime  8/15 Flagyl>> 8/15 ceftriaxone>>     Objective: Vitals:   03/12/20 1005 03/12/20 1020 03/12/20 1035 03/12/20 1054  BP: 111/74 106/71 108/75 110/73  Pulse: 69 68 67 69  Resp: _0 Temp: 97.9 F (36.6 C)   97.8 F (36.6 C)  TempSrc:    Oral  SpO2: 96% 95% 93% 93%  Weight:      Height:        Intake/Output Summary (Last 24 hours) at 03/12/2020 1257 Last data filed at 03/12/2020 1055 Gross per 24 hour  Intake 1720 ml  Output 1875 ml  Net -155 ml   Filed Weights   03/10/20 0458 03/10/20 1334 03/12/20 0500  Weight: 90.1 kg 90.1 kg 93 kg    Examination:  General exam: calm, NAD Respiratory system: Clear to auscultation. Respiratory effort normal. Cardiovascular system: S1 & S2 heard, RRR. No JVD, no murmur, No pedal edema. Gastrointestinal system: Drain to right upper quadrant, abdomen is nondistended, hypoactive bowel sounds heard. Central nervous system: Alert and oriented. No focal neurological deficits. Extremities: Symmetric 5 x 5 power. Skin: No rashes, lesions or ulcers Psychiatry: Judgement and insight appear normal. Mood & affect appropriate.     Data Reviewed: I have personally reviewed following labs and imaging studies  CBC: Recent Labs  Lab 03/07/20 1051 03/07/20 1637 03/07/20 1905 03/08/20 0055 03/10/20 0749 03/11/20 1103 03/11/20 1417  WBC 8.2  --  13.8*  24.2* 10.2 11.7* 11.7*  NEUTROABS 7.5  --   --   --   --   --  8.6*  HGB 14.6   < > 12.3* 11.4* 10.4* 11.8* 11.0*  HCT 44.0   < > 35.2* 32.2* 30.0* 34.9* 32.8*  MCV 90.7  --  89.1 89.0 89.0 90.4 89.4  PLT 287  --  146* 129* 123* 159 147*   < > = values in this interval not displayed.    Basic Metabolic Panel: Recent Labs  Lab 03/07/20 2256 03/08/20 0055 03/08/20 1930 03/09/20 0826 03/10/20 0749 03/11/20 1103 03/12/20 0345  NA 126* 130*  --   --  132* 132* 131*  K 3.0* 3.1*  --   --  3.3* 3.2* 4.1  CL 91* 95*  --   --  99 97* 97*  CO2 21* 22  --   --  _1 GLUCOSE 111* 112*  --   --  130* 108* 91  BUN 43* 40*  --   --  _2 CREATININE 1.67* 1.63*  --   --  0.78 0.81 0.76  CALCIUM 7.4* 7.4*  --   --  7.6* 7.5* 7.2*  MG  --   --  1.6* 1.8  --   --  1.4*    GFR: Estimated Creatinine Clearance: 98.3 mL/min (by C-G formula based on SCr of 0.76 mg/dL).  Liver Function Tests: Recent Labs  Lab 03/07/20 1051 03/07/20 2256 03/09/20 0048 03/10/20 0749 03/11/20 1103  AST 275* 137* 60* 30 17  ALT 434* 231* 146* 93* 57*  ALKPHOS 160* 112 83 78 62  BILITOT 6.8* 3.1* 1.7* 1.5* 1.2  PROT 7.4 5.2* 5.4* 5.3* 5.5*  ALBUMIN 3.3* 2.2* 2.0* 2.1* 2.2*    CBG: Recent Labs  Lab 03/07/20 2220  GLUCAP 98     Recent Results (from the past 240 hour(s))  Blood Culture (routine x 2)     Status: Abnormal   Collection Time: 03/07/20 12:53 PM   Specimen: BLOOD  Result Value Ref Range Status   Specimen Description BLOOD RIGHT ANTECUBITAL  Final   Special Requests   Final    BOTTLES DRAWN AEROBIC AND ANAEROBIC Blood Culture adequate volume   Culture  Setup Time   Final    GRAM NEGATIVE RODS IN BOTH AEROBIC AND ANAEROBIC BOTTLES CRITICAL RESULT CALLED TO, READ BACK BY AND VERIFIED WITH: Serita Grammes [id number] 03/08/2020 Mena Goes Performed at [facility], [address], [place], [place] [address]    Culture KLEBSIELLA PNEUMONIAE (A)  Final   Report Status 03/11/2020  FINAL  Final   Organism ID, Bacteria KLEBSIELLA PNEUMONIAE  Final      Susceptibility   Klebsiella pneumoniae - MIC*    AMPICILLIN RESISTANT Resistant     CEFAZOLIN <=4 SENSITIVE Sensitive     CEFEPIME <=0.12 SENSITIVE Sensitive     CEFTAZIDIME <=1 SENSITIVE Sensitive     CEFTRIAXONE <=0.25 SENSITIVE Sensitive     CIPROFLOXACIN <=0.25 SENSITIVE Sensitive     GENTAMICIN <=1 SENSITIVE Sensitive     IMIPENEM <=0.25 SENSITIVE Sensitive     TRIMETH/SULFA <=20 SENSITIVE Sensitive     AMPICILLIN/SULBACTAM <=2 SENSITIVE Sensitive     PIP/TAZO <=4 SENSITIVE Sensitive     * KLEBSIELLA PNEUMONIAE  Blood Culture ID Panel (Reflexed)     Status: Abnormal   Collection Time: 03/07/20 12:53 PM  Result Value Ref Range Status   Enterococcus faecalis NOT DETECTED NOT DETECTED Final   Enterococcus Faecium NOT DETECTED NOT DETECTED Final   Listeria monocytogenes NOT DETECTED NOT DETECTED Final   Staphylococcus species NOT DETECTED NOT DETECTED Final   Staphylococcus aureus (BCID) NOT DETECTED NOT DETECTED Final   Staphylococcus epidermidis NOT DETECTED NOT DETECTED Final   Staphylococcus lugdunensis NOT DETECTED NOT DETECTED Final   Streptococcus species NOT DETECTED NOT DETECTED Final   Streptococcus agalactiae NOT DETECTED NOT DETECTED Final   Streptococcus pneumoniae NOT DETECTED NOT DETECTED Final   Streptococcus pyogenes NOT DETECTED NOT DETECTED Final   A.calcoaceticus-baumannii NOT DETECTED NOT DETECTED Final   Bacteroides fragilis NOT DETECTED NOT DETECTED Final   Enterobacterales DETECTED (A) NOT DETECTED Final    Comment: Enterobacterales represent a large order of gram negative bacteria, not a single organism. CRITICAL RESULT CALLED TO, READ BACK BY AND VERIFIED WITH: J. LEDFORD,PHARMD [id number] 03/08/2020 T. TYSOR    Enterobacter cloacae complex NOT DETECTED NOT DETECTED Final   Escherichia coli NOT DETECTED NOT DETECTED Final   Klebsiella aerogenes NOT DETECTED NOT DETECTED Final    Klebsiella oxytoca NOT DETECTED NOT DETECTED Final   Klebsiella pneumoniae DETECTED (A) NOT DETECTED Final    Comment: CRITICAL RESULT CALLED TO, READ BACK BY AND  VERIFIED WITH: J. LEDFORD,PHARMD [id number] 03/08/2020 T. TYSOR    Proteus species NOT DETECTED NOT DETECTED Final   Salmonella species NOT DETECTED NOT DETECTED Final   Serratia marcescens NOT DETECTED NOT DETECTED Final   Haemophilus influenzae NOT DETECTED NOT DETECTED Final   Neisseria meningitidis NOT DETECTED NOT DETECTED Final   Pseudomonas aeruginosa NOT DETECTED NOT DETECTED Final   Stenotrophomonas maltophilia NOT DETECTED NOT DETECTED Final   Candida albicans NOT DETECTED NOT DETECTED Final   Candida auris NOT DETECTED NOT DETECTED Final   Candida glabrata NOT DETECTED NOT DETECTED Final   Candida krusei NOT DETECTED NOT DETECTED Final   Candida parapsilosis NOT DETECTED NOT DETECTED Final   Candida tropicalis NOT DETECTED NOT DETECTED Final   Cryptococcus neoformans/gattii NOT DETECTED NOT DETECTED Final   CTX-M ESBL NOT DETECTED NOT DETECTED Final   Carbapenem resistance IMP NOT DETECTED NOT DETECTED Final   Carbapenem resistance KPC NOT DETECTED NOT DETECTED Final   Carbapenem resistance NDM NOT DETECTED NOT DETECTED Final   Carbapenem resist OXA 48 LIKE NOT DETECTED NOT DETECTED Final   Carbapenem resistance VIM NOT DETECTED NOT DETECTED Final    Comment: Performed at [facility], [address] [place], [place] [address]  Blood Culture (routine x 2)     Status: Abnormal   Collection Time: 03/07/20  1:22 PM   Specimen: BLOOD LEFT FOREARM  Result Value Ref Range Status   Specimen Description BLOOD LEFT FOREARM  Final   Special Requests   Final    BOTTLES DRAWN AEROBIC AND ANAEROBIC Blood Culture results may not be optimal due to an inadequate volume of blood received in culture bottles   Culture  Setup Time   Final    GRAM NEGATIVE RODS IN BOTH AEROBIC AND ANAEROBIC BOTTLES CRITICAL RESULT CALLED TO, READ  BACK BY AND VERIFIED WITH: J. LEDFORD,PHARMD [id number] 03/08/2020 T. TYSOR    Culture (A)  Final    KLEBSIELLA PNEUMONIAE SUSCEPTIBILITIES PERFORMED ON PREVIOUS CULTURE WITHIN THE LAST 5 DAYS. Performed at [facility], [place] [address] [place], [place] [address]    Report Status 03/11/2020 FINAL  Final  SARS Coronavirus 2 by RT PCR (hospital order, performed in [facility] hospital lab) Nasopharyngeal Nasopharyngeal Swab     Status: None   Collection Time: 03/07/20  1:22 PM   Specimen: Nasopharyngeal Swab  Result Value Ref Range Status   SARS Coronavirus 2 NEGATIVE NEGATIVE Final    Comment: (NOTE) SARS-CoV-2 target nucleic acids are NOT DETECTED.  The SARS-CoV-2 RNA is generally detectable in upper and lower respiratory specimens during the acute phase of infection. The lowest concentration of SARS-CoV-2 viral copies this assay can detect is 250 copies / mL. A negative result does not preclude SARS-CoV-2 infection and should not be used as the sole basis for treatment or other patient management decisions.  A negative result may occur with improper specimen collection / handling, submission of specimen other than nasopharyngeal swab, presence of viral mutation(s) within the areas targeted by this assay, and inadequate number of viral copies (<250 copies / mL). A negative result must be combined with clinical observations, patient history, and epidemiological information.  Fact Sheet for Patients:   StrictlyIdeas.no  Fact Sheet for Healthcare Providers: BankingDealers.co.za  This test is not yet approved or  cleared by the [place] FDA and has been authorized for detection and/or diagnosis of SARS-CoV-2 by FDA under an Emergency Use Authorization (EUA).  This EUA will remain in effect (  meaning this test can be used) for the duration of the COVID-19 declaration under Section 564(b)(1) of the Act, 21 U.S.C. section  360bbb-3(b)(1), unless the authorization is terminated or revoked sooner.  Performed at [facility], [place] [address], [place], [place] [address]   Urine culture     Status: None   Collection Time: 03/07/20  1:24 PM   Specimen: In/Out Cath Urine  Result Value Ref Range Status   Specimen Description IN/OUT CATH URINE  Final   Special Requests NONE  Final   Culture   Final    NO GROWTH Performed at [facility], [place] [address] [place], [place] [address]    Report Status 03/08/2020 FINAL  Final  MRSA PCR Screening     Status: None   Collection Time: 03/07/20 10:24 PM   Specimen: Nasal Mucosa; Nasopharyngeal  Result Value Ref Range Status   MRSA by PCR NEGATIVE NEGATIVE Final    Comment:        The GeneXpert MRSA Assay (FDA approved for NASAL specimens only), is one component of a comprehensive MRSA colonization surveillance program. It is not intended to diagnose MRSA infection nor to guide or monitor treatment for MRSA infections. Performed at [facility], [place] [address], [place], [place] [address]   Aerobic/Anaerobic Culture (surgical/deep wound)     Status: None (Preliminary result)   Collection Time: 03/09/20  3:57 PM   Specimen: Abscess  Result Value Ref Range Status   Specimen Description ABSCESS ABDOMEN  Final   Special Requests NONE  Final   Gram Stain   Final    RARE WBC PRESENT,BOTH PMN AND MONONUCLEAR NO ORGANISMS SEEN    Culture   Final    NO GROWTH 3 DAYS NO ANAEROBES ISOLATED; CULTURE IN PROGRESS FOR 5 DAYS Performed at [facility], [place] [address] [place], [place] [address]    Report Status PENDING  Incomplete  Surgical pcr screen     Status: None   Collection Time: 03/11/20  9:19 PM   Specimen: Nasal Mucosa; Nasal Swab  Result Value Ref Range Status   MRSA, PCR NEGATIVE NEGATIVE Final   Staphylococcus aureus NEGATIVE NEGATIVE Final    Comment: (NOTE) The Xpert SA Assay (FDA approved for NASAL specimens in patients  48 years of age and older), is one component of a comprehensive surveillance program. It is not intended to diagnose infection nor to guide or monitor treatment. Performed at [facility], [place] [address], [place], [place] [address]          Radiology Studies: CT ABDOMEN PELVIS W CONTRAST  Result Date: 03/11/2020 CLINICAL DATA:  Abdominal distension, left upper quadrant fluid collection EXAM: CT ABDOMEN AND PELVIS WITH CONTRAST TECHNIQUE: Multidetector CT imaging of the abdomen and pelvis was performed using the standard protocol following bolus administration of intravenous contrast. CONTRAST:  160m OMNIPAQUE IOHEXOL 300 MG/ML  SOLN COMPARISON:  03/07/2020, 03/08/2020, 03/09/2020 FINDINGS: Lower chest: There is progressive bilateral lower lobe atelectasis. Small bilateral pleural effusions are noted. Hepatobiliary: The dome of the right lobe of the liver is excluded by slice selection. Intrahepatic duct dilation is again identified, not appreciably changed. The gallbladder is decompressed. Common bile duct measures up to 10 mm in diameter. Pancreas: Mild dilatation of the pancreatic duct within the uncinate process unchanged. Otherwise the pancreas is unremarkable. Spleen: Normal in size without focal abnormality. Adrenals/Urinary Tract: Adrenal glands are unremarkable. Kidneys are normal, without renal calculi, focal lesion, or  hydronephrosis. Bladder is unremarkable. Stomach/Bowel: No bowel obstruction or ileus. Diverticulosis of the distal colon without diverticulitis. Normal appendix right lower quadrant. No bowel wall thickening or inflammatory change. Vascular/Lymphatic: No significant vascular findings are present. No enlarged abdominal or pelvic lymph nodes. Reproductive: Prostate is unremarkable. Other: Interval placement of a percutaneous drain within the left upper quadrant fluid collection. The inferior aspect of the fluid collection is near completely resolved after drainage. The  more superior aspect of the fluid collection along the ventral aspect left lobe liver persists, measuring 2.9 x 5.9 cm in size. There does appear to be communication between the 2 fluid collections however. No evidence of pneumoperitoneum.  No abdominal wall hernia. Musculoskeletal: No acute or destructive bony lesions. Reconstructed images demonstrate no additional findings. IMPRESSION: 1. Interval placement of a pigtail drainage catheter within the inferior extent of a left upper quadrant fluid collection. While the inferior extent of the fluid collection adjacent to the percutaneous drain has near completely resolved, superior aspect of the fluid collection is actually slightly larger than previous evaluations. There does appear to be communication between the inferior and superior extent of the fluid collection however. Continued follow-up is recommended. 2. Persistent intrahepatic biliary duct dilation, with marked improvement in the common bile duct dilation seen previously. Findings consistent with interval ERCP and stone retrieval. 3. No bowel obstruction or ileus. Diverticulosis without diverticulitis. 4. Small bilateral pleural effusions, with progressive bilateral lower lobe atelectasis. Electronically Signed   By: Randa Ngo M.D.   On: 03/11/2020 17:18   DG ERCP BILIARY & PANCREATIC DUCTS  Result Date: 03/11/2020 CLINICAL DATA:  Bile duct stone. EXAM: ERCP TECHNIQUE: Multiple spot images obtained with the fluoroscopic device and submitted for interpretation post-procedure. FLUOROSCOPY TIME:  Fluoroscopy Time:  2 minutes and 43 seconds Radiation Exposure Index (if provided by the fluoroscopic device): 31.84 mGy COMPARISON:  MRI 03/08/2020 FINDINGS: Retrograde cholangiogram demonstrates a dilated biliary system. Wire was advanced into a left hepatic duct. Questionable small filling defect in the distal common bile duct. Evidence for a balloon sweep. Percutaneous drain in the left upper abdomen.  IMPRESSION: 1. Dilated biliary system. 2. Questionable filling defect in the distal common bile duct could represent a small stone. Evidence for balloon sweep. 3. Patent cystic duct. These images were submitted for radiologic interpretation only. Please see the procedural report for the amount of contrast and the fluoroscopy time utilized. Electronically Signed   By: Markus Daft M.D.   On: 03/11/2020 08:17        Scheduled Meds: . acetaminophen  650 mg Oral Q6H  . Chlorhexidine Gluconate Cloth  6 each Topical Daily  . enoxaparin (LOVENOX) injection  40 mg Subcutaneous Q24H  . mouth rinse  15 mL Mouth Rinse BID  . sodium chloride flush  5 mL Intracatheter Q8H   Continuous Infusions: . cefTRIAXone (ROCEPHIN)  IV 2 g (03/11/20 1804)  . lactated ringers 10 mL/hr at 03/10/20 2136  . magnesium sulfate bolus IVPB    . metronidazole 500 mg (03/12/20 0533)     LOS: 5 days   Time spent: 35 mins Greater than 50% of this time was spent in counseling, explanation of diagnosis, planning of further management, and coordination of care.  I have personally reviewed and interpreted on  03/12/2020 daily labs, imagings as discussed above under date review session and assessment and plans.  I reviewed all nursing notes, pharmacy notes, consultant notes,  vitals, pertinent old records  I have discussed plan of care as  described above with RN , patient on 03/12/2020  Voice Recognition /Dragon dictation system was used to create this note, attempts have been made to correct errors. Please contact the author with questions and/or clarifications.   Florencia Reasons, MD PhD FACP [facility]  Available via Epic secure chat 7am-7pm for nonurgent issues Please page for urgent issues To page the attending provider between 7A-7P or the covering provider during after hours 7P-7A, please log into the web site [email or web address] and access using universal Hall password for that web site. If you do not have the  password, please call the hospital operator.    03/12/2020, 12:57 PM

## 2020-03-12 NOTE — Op Note (Addendum)
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, purulent rind/gelatinous purulent collection along the anterior peritoneum extending from the mid epigastrium over the superior surface of the left lobe of the liver  Specimens: Gallbladder, intra-abdominal tissue Retained items: 6 French round Blake drain which goes through the gallbladder fossa and extends to the left upper abdomen in the region of prior IR drain EBL: 50cc Complications: none  Description of procedure: After obtaining informed consent the patient was taken to the operating room and placed supine on operating room table wheregeneral endotracheal anesthesia was initiated, preoperative antibiotics were administered, SCDs applied, and a formal timeout was performed.  The abdomen was prepped and draped in usual sterile fashion.  Peritoneal access was gained with an infraumbilical Veress needle and insufflation to 15 mmHg ensued without incident.  A 5 mm trocar and camera were then placed, the bowel was inspected and noted to be free of injury, however there was a small amount of bleeding from the umbilical puncture site.  There was also noted to be dense adhesions of omentum in the upper abdomen surrounding the percutaneous drain that had been placed a couple days ago.  The patient was placed in reverse Trendelenburg and rotated slightly to the left.  After infiltration with local, 2 additional 5 mm trochars were placed in the right anterior axillary and midclavicular lines, and an 11 mm trocar was placed in the epigastrium.   The gallbladder was encased in dense  fibrotic inflammatory adhesions of omentum and the colon and duodenum were tethered to the gallbladder.  Careful blunt dissection and cautery where transparent layers could be dissected ensued to sweep the omentum and viscera away from the gallbladder, ensuring no injury while doing so. The peritoneum overlying the gallbladder is extremely thickened and fibrotic and the porta is also clearly tethered toward the gallbladder.  The gallbladder was retracted cephalad and then careful blunt and cautery dissection through transparent layers was used to work through the peritoneum until the infundibulum could be clearly identified and retracted laterally.  We were able to gradually clear the tissues away from the gallbladder neck cystic duct junction.  The artery was diminutive and was divided with cautery.  The cystic duct was quite short.  I continued dissecting the gallbladder away from the liver bed with careful blunt and cautery dissection until it was connected to the patient only by the cystic duct.  2 PDS Endoloops were used to ligate the proximal cystic duct and then this was divided distally sharply.  The gallbladder was placed in an Endo Catch bag and extracted through the epigastric trocar site.  A minimal amount of bleeding on the liver bed was controlled with cautery and Surgicel snow.  The right upper quadrant was irrigated and aspirated, the effluent was clear.  The duodenum was again inspected and confirmed to be free of injury, and there was no bile leak from the liver bed or from the ligated cystic duct.    At this point we turned to the the epigastrium to the patient's left of the falciform where the fluid collection which have been present on presentation was located with a percutaneous drain coursing through this.  An additional 5 mm trocar was placed in  the left lower quadrant and careful blunt dissection ensued to release the omentum from its inflammatory adhesions to the infectious process and to  the percutaneous drain.  The drain was removed as it was freely floating in the peritoneal cavity.  Purulent rind was debrided from the peritoneum along the anterior abdominal wall, as well as a large gelatinous mass of purulent rind which was bluntly separated from anterior to the left lateral lobe of the liver.  Additional rind was debrided from the underside of the diaphragm.  There was not any clear liver pathology, the anterior surface of the stomach appeared to be free of injury or perforation, and so the source of this problem remains unclear.  The excised tissue was placed in an Endo Catch bag and extracted and this will be sent for pathology and cultures.  This area was irrigated and aspirated.  There does not appear to be any ongoing contamination from the liver or from the stomach.  The omentum which had been adherent appeared viable and well perfused.  A 19 French round Blake drain was introduced through the right lateral trocar site.  This courses through the gallbladder fossa as well as into the region of the fluid collection anterior and just lateral to the left lateral liver lobe.  This is secured the skin with a 2-0 nylon.  The epigastric trocar site was closed with a 0 Vicryl in the fascia using a laparoscopic suture passer.  The abdomen was then desufflated and the remaining trochars removed.  The incisions were closed with subcuticular Monocryl and Dermabond.  The patient was then awakened, extubated and taken to PACU in stable condition.   All counts were correct at the completion of the case.

## 2020-03-12 NOTE — Anesthesia Preprocedure Evaluation (Signed)
Anesthesia Evaluation  Patient identified by MRN, date of birth, ID band Patient awake    Reviewed: Allergy & Precautions, NPO status , Patient's Chart, lab work & pertinent test results  History of Anesthesia Complications Negative for: history of anesthetic complications  Airway Mallampati: III  TM Distance: >3 FB Neck ROM: Full    Dental  (+) Dental Advisory Given, Poor Dentition   Pulmonary neg pulmonary ROS, neg recent URI,    breath sounds clear to auscultation       Cardiovascular negative cardio ROS   Rhythm:Regular     Neuro/Psych negative neurological ROS  negative psych ROS   GI/Hepatic Neg liver ROS, cholelithiasis   Endo/Other  negative endocrine ROS  Renal/GU negative Renal ROS     Musculoskeletal negative musculoskeletal ROS (+)   Abdominal   Peds  Hematology negative hematology ROS (+)   Anesthesia Other Findings   Reproductive/Obstetrics                             Anesthesia Physical Anesthesia Plan  ASA: I  Anesthesia Plan: General   Post-op Pain Management:    Induction: Intravenous  PONV Risk Score and Plan: 1 and Ondansetron and Dexamethasone  Airway Management Planned: Oral ETT  Additional Equipment: None  Intra-op Plan:   Post-operative Plan: Extubation in OR  Informed Consent: I have reviewed the patients History and Physical, chart, labs and discussed the procedure including the risks, benefits and alternatives for the proposed anesthesia with the patient or authorized representative who has indicated his/her understanding and acceptance.    Discussed DNR with patient and Suspend DNR.   Dental advisory given  Plan Discussed with: CRNA and Surgeon  Anesthesia Plan Comments:         Anesthesia Quick Evaluation

## 2020-03-12 NOTE — Progress Notes (Signed)
Pt went to OR for surgery. 

## 2020-03-13 LAB — CBC WITH DIFFERENTIAL/PLATELET
Abs Immature Granulocytes: 0.85 10*3/uL — ABNORMAL HIGH (ref 0.00–0.07)
Basophils Absolute: 0.1 10*3/uL (ref 0.0–0.1)
Basophils Relative: 0 %
Eosinophils Absolute: 0.1 10*3/uL (ref 0.0–0.5)
Eosinophils Relative: 0 %
HCT: 34.8 % — ABNORMAL LOW (ref 39.0–52.0)
Hemoglobin: 11.9 g/dL — ABNORMAL LOW (ref 13.0–17.0)
Immature Granulocytes: 5 %
Lymphocytes Relative: 9 %
Lymphs Abs: 1.4 10*3/uL (ref 0.7–4.0)
MCH: 30.3 pg (ref 26.0–34.0)
MCHC: 34.2 g/dL (ref 30.0–36.0)
MCV: 88.5 fL (ref 80.0–100.0)
Monocytes Absolute: 1.2 10*3/uL — ABNORMAL HIGH (ref 0.1–1.0)
Monocytes Relative: 7 %
Neutro Abs: 12.6 10*3/uL — ABNORMAL HIGH (ref 1.7–7.7)
Neutrophils Relative %: 79 %
Platelets: 287 10*3/uL (ref 150–400)
RBC: 3.93 MIL/uL — ABNORMAL LOW (ref 4.22–5.81)
RDW: 13.6 % (ref 11.5–15.5)
WBC: 16.1 10*3/uL — ABNORMAL HIGH (ref 4.0–10.5)
nRBC: 0 % (ref 0.0–0.2)

## 2020-03-13 LAB — CBC
HCT: 34.5 % — ABNORMAL LOW (ref 39.0–52.0)
Hemoglobin: 11.9 g/dL — ABNORMAL LOW (ref 13.0–17.0)
MCH: 31.2 pg (ref 26.0–34.0)
MCHC: 34.5 g/dL (ref 30.0–36.0)
MCV: 90.3 fL (ref 80.0–100.0)
Platelets: 266 10*3/uL (ref 150–400)
RBC: 3.82 MIL/uL — ABNORMAL LOW (ref 4.22–5.81)
RDW: 13.9 % (ref 11.5–15.5)
WBC: 16 10*3/uL — ABNORMAL HIGH (ref 4.0–10.5)
nRBC: 0 % (ref 0.0–0.2)

## 2020-03-13 LAB — COMPREHENSIVE METABOLIC PANEL
ALT: 33 U/L (ref 0–44)
AST: 14 U/L — ABNORMAL LOW (ref 15–41)
Albumin: 2.1 g/dL — ABNORMAL LOW (ref 3.5–5.0)
Alkaline Phosphatase: 53 U/L (ref 38–126)
Anion gap: 10 (ref 5–15)
BUN: 13 mg/dL (ref 8–23)
CO2: 24 mmol/L (ref 22–32)
Calcium: 7.5 mg/dL — ABNORMAL LOW (ref 8.9–10.3)
Chloride: 99 mmol/L (ref 98–111)
Creatinine, Ser: 0.81 mg/dL (ref 0.61–1.24)
GFR calc Af Amer: >60 mL/min (ref >60)
GFR calc non Af Amer: >60 mL/min (ref >60)
Glucose, Bld: 117 mg/dL — ABNORMAL HIGH (ref 70–99)
Potassium: 3.9 mmol/L (ref 3.5–5.1)
Sodium: 133 mmol/L — ABNORMAL LOW (ref 135–145)
Total Bilirubin: 0.6 mg/dL (ref 0.3–1.2)
Total Protein: 5.5 g/dL — ABNORMAL LOW (ref 6.5–8.1)

## 2020-03-13 LAB — ACID FAST SMEAR (AFB, MYCOBACTERIA): Acid Fast Smear: NEGATIVE

## 2020-03-13 LAB — LIPASE, BLOOD: Lipase: 83 U/L — ABNORMAL HIGH (ref 11–51)

## 2020-03-13 LAB — MAGNESIUM: Magnesium: 1.8 mg/dL (ref 1.7–2.4)

## 2020-03-13 MED ORDER — SENNOSIDES-DOCUSATE SODIUM 8.6-50 MG PO TABS
1.0000 | ORAL_TABLET | Freq: Two times a day (BID) | ORAL | Status: DC
  Administered 2020-03-13 – 2020-03-15 (×5): 1 via ORAL
  Filled 2020-03-13 (×5): qty 1

## 2020-03-13 MED ORDER — MAGNESIUM SULFATE 2 GM/50ML IV SOLN
2.0000 g | Freq: Once | INTRAVENOUS | Status: AC
  Administered 2020-03-13: 2 g via INTRAVENOUS
  Filled 2020-03-13: qty 50

## 2020-03-13 NOTE — Progress Notes (Signed)
[facility]:  [phone] General Surgery Progress Note   LOS: 6 days  POD -  1 Day Post-Op  Assessment and Plan: 1.  LAPAROSCOPIC CHOLECYSTECTOMY IRRIGATION AND DEBRIDEMENT ABDOMINAL ABSCESS - 8/20 - Connor  Rocephin/Flagyl -   WBC - 16,000 - 03/13/2020  Feels better.  On reg food.  Recheck CBC tomorrow.   2.  ERCP - 8/19 - [place] 3.  DVT prophylaxis - Lovenox 4.  Not interested in the Covid vaccine   Active Problems:   Pressure injury of skin   Abscess of abdominal cavity (HCC)   Hyperbilirubinemia   Dilated bile duct  Subjective:  Feels better.  PO intake okay.  Abdomen less sore.  He lives by self.  He is unemployed right now.  Objective:   Vitals:   03/13/20 0145 03/13/20 0546  BP: 120/81 128/81  Pulse: 64 63  Resp: 16 20  Temp: 98 F (36.7 C) 98.6 F (37 C)  SpO2: 97% 97%     Intake/Output from previous day:  08/20 0701 - 08/21 0700 In: 2680 [P.O.:840; I.V.:1195.7; IV Piggyback:589.2] Out: 2430 [Urine:2150; Drains:260; Blood:20]  Intake/Output this shift:  Total I/O In: -  Out: 330 [Urine:300; Drains:30]   Physical Exam:   General: WN WM who is alert and oriented.    HEENT: Normal. Pupils equal. .   Lungs: Clear.  IS = 750 cc   Abdomen: Mild distention.  Rare BS.  No localized tenderness.  Drain in RUQ - 260 cc recorded last 24 hours.   Wound: Clean   Lab Results:    Recent Labs    03/11/20 1417 03/13/20 0419  WBC 11.7* 16.0*  HGB 11.0* 11.9*  HCT 32.8* 34.5*  PLT 147* 266    BMET   Recent Labs    03/12/20 0345 03/13/20 0419  NA 131* 133*  K 4.1 3.9  CL 97* 99  CO2 24 24  GLUCOSE 91 117*  BUN 12 13  CREATININE 0.76 0.81  CALCIUM 7.2* 7.5*    PT/INR  No results for input(s): LABPROT, INR in the last 72 hours.  ABG  No results for input(s): PHART, HCO3 in the last 72 hours.  Invalid input(s): PCO2, PO2   Studies/Results:  CT ABDOMEN PELVIS W CONTRAST  Result Date: 03/11/2020 CLINICAL DATA:  Abdominal  distension, left upper quadrant fluid collection EXAM: CT ABDOMEN AND PELVIS WITH CONTRAST TECHNIQUE: Multidetector CT imaging of the abdomen and pelvis was performed using the standard protocol following bolus administration of intravenous contrast. CONTRAST:  OMNIPAQUE IOHEXOL 300 MG/ML  SOLN COMPARISON:  03/07/2020, 03/08/2020, 03/09/2020 FINDINGS: Lower chest: There is progressive bilateral lower lobe atelectasis. Small bilateral pleural effusions are noted. Hepatobiliary: The dome of the right lobe of the liver is excluded by slice selection. Intrahepatic duct dilation is again identified, not appreciably changed. The gallbladder is decompressed. Common bile duct measures up to 10 mm in diameter. Pancreas: Mild dilatation of the pancreatic duct within the uncinate process unchanged. Otherwise the pancreas is unremarkable. Spleen: Normal in size without focal abnormality. Adrenals/Urinary Tract: Adrenal glands are unremarkable. Kidneys are normal, without renal calculi, focal lesion, or hydronephrosis. Bladder is unremarkable. Stomach/Bowel: No bowel obstruction or ileus. Diverticulosis of the distal colon without diverticulitis. Normal appendix right lower quadrant. No bowel wall thickening or inflammatory change. Vascular/Lymphatic: No significant vascular findings are present. No enlarged abdominal or pelvic lymph nodes. Reproductive: Prostate is unremarkable. Other: Interval placement of a percutaneous drain within the left upper quadrant fluid collection.  The inferior aspect of the fluid collection is near completely resolved after drainage. The more superior aspect of the fluid collection along the ventral aspect left lobe liver persists, measuring 2.9 x 5.9 cm in size. There does appear to be communication between the 2 fluid collections however. No evidence of pneumoperitoneum.  No abdominal wall hernia. Musculoskeletal: No acute or destructive bony lesions. Reconstructed images demonstrate no  additional findings. IMPRESSION: 1. Interval placement of a pigtail drainage catheter within the inferior extent of a left upper quadrant fluid collection. While the inferior extent of the fluid collection adjacent to the percutaneous drain has near completely resolved, superior aspect of the fluid collection is actually slightly larger than previous evaluations. There does appear to be communication between the inferior and superior extent of the fluid collection however. Continued follow-up is recommended. 2. Persistent intrahepatic biliary duct dilation, with marked improvement in the common bile duct dilation seen previously. Findings consistent with interval ERCP and stone retrieval. 3. No bowel obstruction or ileus. Diverticulosis without diverticulitis. 4. Small bilateral pleural effusions, with progressive bilateral lower lobe atelectasis. Electronically Signed   By: Sharlet Salina M.D.   On: 03/11/2020 17:18     Anti-infectives:   Anti-infectives (From admission, onward)   Start     Dose/Rate Route Frequency Ordered Stop   03/08/20 1400  vancomycin (VANCOREADY) IVPB 1250 mg/250 mL  Status:  Discontinued        1,250 mg 166.7 mL/hr over 90 Minutes Intravenous Every 24 hours 03/07/20 1320 03/07/20 1746   03/08/20 1300  vancomycin (VANCOREADY) IVPB 1250 mg/250 mL  Status:  Discontinued        1,250 mg 166.7 mL/hr over 90 Minutes Intravenous Every 24 hours 03/07/20 1309 03/07/20 1320   03/08/20 0100  ceFEPIme (MAXIPIME) 2 g in sodium chloride 0.9 % 100 mL IVPB  Status:  Discontinued        2 g 200 mL/hr over 30 Minutes Intravenous Every 12 hours 03/07/20 1309 03/07/20 1745   03/07/20 2130  metroNIDAZOLE (FLAGYL) IVPB 500 mg        500 mg 100 mL/hr over 60 Minutes Intravenous Every 8 hours 03/07/20 1744     03/07/20 1745  cefTRIAXone (ROCEPHIN) 2 g in sodium chloride 0.9 % 100 mL IVPB        2 g 200 mL/hr over 30 Minutes Intravenous Every 24 hours 03/07/20 1744     03/07/20 1315  vancomycin  (VANCOREADY) IVPB 1500 mg/300 mL        1,500 mg 150 mL/hr over 120 Minutes Intravenous  Once 03/07/20 1309 03/07/20 1630   03/07/20 1300  ceFEPIme (MAXIPIME) 2 g in sodium chloride 0.9 % 100 mL IVPB        2 g 200 mL/hr over 30 Minutes Intravenous  Once 03/07/20 1254 03/07/20 1446   03/07/20 1300  metroNIDAZOLE (FLAGYL) IVPB 500 mg        500 mg 100 mL/hr over 60 Minutes Intravenous  Once 03/07/20 1254 03/07/20 1445   03/07/20 1300  vancomycin (VANCOCIN) IVPB 1000 mg/200 mL premix  Status:  Discontinued        1,000 mg 200 mL/hr over 60 Minutes Intravenous  Once 03/07/20 1254 03/07/20 1302      Ovidio Kin, MD, [facility] Office: [phone] 03/13/2020

## 2020-03-13 NOTE — Progress Notes (Signed)
PROGRESS NOTE    Brandon Spence  WER:[id number] DOB: 02/18/1951 DOA: 03/07/2020 PCP: Patient, No Pcp Per    Chief Complaint  Patient presents with  . Dehydration    Brief Narrative:  Brandon Spence is a 69 year old male without known PMH who presents to ED 8/15 with dark urine following progressive feelings of general unwellness, weakness, fatigue x 3 days after prolonged sun exposure. He also reported severe cramping abdominal pain that started on 8/14. The pain was diffuse and intermittent.   In ED, met sepsis criteria and was resuscitated with 30cc/kg, started on empiric abx. Initial labs reveal myriad of metabolic derrangements including Na 123 K 3.3 Co2 17 Cr 1.98 AG 24 Alk phos 160 AST 275 ALT 434 Tbili 6.8. On exam he was jaundiced and sent for CT abdomen pelvis which revealed intra and extrahepatic biliary ductal dilation, CBD measuring 30m. There is a hypoecoic lesion in the pancreatic head measuring 2.8 x 1.2cm.   Patient is complaining of fatigue, belching and some abdominal pain. Blood pressure remained 80/60 despite fluid resuscitation.  He is started on pressors and admitted to ICU.  Significant Diagnostic Tests:  8/15 CT a/p noncon> intra and extrahepatic biliary ductal dilatation, common bile duct 245mwithout definite obstructing lesion. Pancreatic head with hypoechoic lesion 2.8cm x 1.2 cm (mass vs dilated portion of main pancreatic duct?) Moderate amount of fluid between stomach and liver.   Micro Data:  8/15 SARS Cov2 neg 8/15 BCx> 8/15 UA>  Antimicrobials:  8/15 vanc 8/15 cefepime  8/15 Flagyl>> 8/15 ceftriaxone>>   Subjective: POD#1, no fever, pain has improved, tolerated diet He has a JP drain on right side with serosanguineous drainage WBC elevated  Assessment & Plan:   Active Problems:   Pressure injury of skin   Abscess of abdominal cavity (HCC)   Hyperbilirubinemia   Dilated bile duct   Shock likely due to hypovolemia and septic shock  from biliary abdomen source, Klebsiella pneumonia bacteremia Present on admission -Is started on pressors and admitted to the ICU initially on admission , he is off pressor, transferred out of ICU to hospitalist service on August 19 -Blood culture positive for Klebsiella pneumoniae, abdominal drain culture no growth, SARS-CoV-2 screening negative, MRSA screening negative -Continue Rocephin and Flagyl for now, wbc elevated today, repeat in the morning, consider transition to Augmentin if WBC improves  Gallstones /biliary ductal dilatation/possible CBD stone /abdominal fluid collection/lft elevation -Status post IR drain on August 18 -Status post ERCP on August 18, no CBD stone found, The ampulla had a small ulcer, possibly due to passed CBD stone.  Status post sphincterotomy -lft normalized  -repeat ct ab/pel on 8/19 with persistent fluid collection, persistent intrahepatic biliary ductal dilatation. -s/p Laparoscopic cholecystectomy, debridement of intra-abdominal abscess and drain placement on 8/20 -Management per general surgery, duration of antibiotic per general surgery  AKI -BUN 43/creatinine 1.98 on presentation -Likely from dehydration and sepsis, CK also mildly elevated on presentation 528, CK trending down on repeat -Renal function normalized since August 18  Normocytic anemia -Hemoglobin 11.8, no sign of bleeding -Monitor  Hyponatremia -Sodium 123 on presentation -Likely from dehydration, he did report drink 1 can of beer daily -Improving, sodium 133 today   Hypokalemia, replaced normalized Hypomagnesemia, give IV mag today  Thrombocytopenia , likely reactive due to sepsis nadir at 129, platelet normalized on August 19  Elevated fasting blood glucose, possibly stress-induced A1c 5.8 A.m. blood glucose 91 this morning   DVT prophylaxis: enoxaparin (LOVENOX) injection 40  mg Start: 03/10/20 1100 SCDs Start: 03/07/20 1645   Code Status: DNR Family Communication:  Patient Disposition:   Status is: Inpatient   Dispo: The patient is from: Home              Anticipated d/c is to: Home              Anticipated d/c date is: To be determined             Needs general surgery clearance  Consultants:   Critical care admission, transfer to hospitalist on August 19  GI  General surgery  IR  Procedures:   IR drain placement on August 18  ERCP with sphincterotomy  on August 18 s/p Laparoscopic cholecystectomy, debridement of intra-abdominal abscess and drain placement on 8/20  Antimicrobials:   8/15 vanc 8/15 cefepime  8/15 Flagyl>> 8/15 ceftriaxone>>     Objective: Vitals:   03/12/20 1441 03/12/20 2101 03/13/20 0145 03/13/20 0546  BP: 106/71 105/69 120/81 128/81  Pulse: 69 73 64 63  Resp: '18 18 16 20  ' Temp: 97.8 F (36.6 C) 98.2 F (36.8 C) 98 F (36.7 C) 98.6 F (37 C)  TempSrc: Oral Oral Oral Oral  SpO2: 93% 96% 97% 97%  Weight:      Height:        Intake/Output Summary (Last 24 hours) at 03/13/2020 0932 Last data filed at 03/13/2020 0753 Gross per 24 hour  Intake 1579.95 ml  Output 2740 ml  Net -1160.05 ml   Filed Weights   03/10/20 0458 03/10/20 1334 03/12/20 0500  Weight: 90.1 kg 90.1 kg 93 kg    Examination:  General exam: calm, NAD Respiratory system: Clear to auscultation. Respiratory effort normal. Cardiovascular system: S1 & S2 heard, RRR. No JVD, no murmur, No pedal edema. Gastrointestinal system: Drain to right upper quadrant, abdomen is nondistended, positive bowel sounds. Central nervous system: Alert and oriented. No focal neurological deficits. Extremities: Symmetric 5 x 5 power. Skin: No rashes, lesions or ulcers Psychiatry: Judgement and insight appear normal. Mood & affect appropriate.     Data Reviewed: I have personally reviewed following labs and imaging studies  CBC: Recent Labs  Lab 03/07/20 1051 03/07/20 1637 03/10/20 0749 03/11/20 1103 03/11/20 1417 03/13/20 0419  03/13/20 0849  WBC 8.2   < > 10.2 11.7* 11.7* 16.0* 16.1*  NEUTROABS 7.5  --   --   --  8.6*  --  PENDING  HGB 14.6   < > 10.4* 11.8* 11.0* 11.9* 11.9*  HCT 44.0   < > 30.0* 34.9* 32.8* 34.5* 34.8*  MCV 90.7   < > 89.0 90.4 89.4 90.3 88.5  PLT 287   < > 123* 159 147* 266 287   < > = values in this interval not displayed.    Basic Metabolic Panel: Recent Labs  Lab 03/08/20 0055 03/08/20 1930 03/09/20 0826 03/10/20 0749 03/11/20 1103 03/12/20 0345 03/13/20 0419  NA 130*  --   --  132* 132* 131* 133*  K 3.1*  --   --  3.3* 3.2* 4.1 3.9  CL 95*  --   --  99 97* 97* 99  CO2 22  --   --  '25 25 24 24  ' GLUCOSE 112*  --   --  130* 108* 91 117*  BUN 40*  --   --  '13 12 12 13  ' CREATININE 1.63*  --   --  0.78 0.81 0.76 0.81  CALCIUM 7.4*  --   --  7.6* 7.5* 7.2* 7.5*  MG  --  1.6* 1.8  --   --  1.4* 1.8    GFR: Estimated Creatinine Clearance: 97.1 mL/min (by C-G formula based on SCr of 0.81 mg/dL).  Liver Function Tests: Recent Labs  Lab 03/07/20 2256 03/09/20 0048 03/10/20 0749 03/11/20 1103 03/13/20 0419  AST 137* 60* 30 17 14*  ALT 231* 146* 93* 57* 33  ALKPHOS 112 83 78 62 53  BILITOT 3.1* 1.7* 1.5* 1.2 0.6  PROT 5.2* 5.4* 5.3* 5.5* 5.5*  ALBUMIN 2.2* 2.0* 2.1* 2.2* 2.1*    CBG: Recent Labs  Lab 03/07/20 2220  GLUCAP 98     Recent Results (from the past 240 hour(s))  Blood Culture (routine x 2)     Status: Abnormal   Collection Time: 03/07/20 12:53 PM   Specimen: BLOOD  Result Value Ref Range Status   Specimen Description BLOOD RIGHT ANTECUBITAL  Final   Special Requests   Final    BOTTLES DRAWN AEROBIC AND ANAEROBIC Blood Culture adequate volume   Culture  Setup Time   Final    GRAM NEGATIVE RODS IN BOTH AEROBIC AND ANAEROBIC BOTTLES CRITICAL RESULT CALLED TO, READ BACK BY AND VERIFIED WITH: Serita Grammes 1610 03/08/2020 Mena Goes Performed at [facility], [address], [place], [place] [address]    Culture KLEBSIELLA PNEUMONIAE (A)  Final    Report Status 03/11/2020 FINAL  Final   Organism ID, Bacteria KLEBSIELLA PNEUMONIAE  Final      Susceptibility   Klebsiella pneumoniae - MIC*    AMPICILLIN RESISTANT Resistant     CEFAZOLIN <=4 SENSITIVE Sensitive     CEFEPIME <=0.12 SENSITIVE Sensitive     CEFTAZIDIME <=1 SENSITIVE Sensitive     CEFTRIAXONE <=0.25 SENSITIVE Sensitive     CIPROFLOXACIN <=0.25 SENSITIVE Sensitive     GENTAMICIN <=1 SENSITIVE Sensitive     IMIPENEM <=0.25 SENSITIVE Sensitive     TRIMETH/SULFA <=20 SENSITIVE Sensitive     AMPICILLIN/SULBACTAM <=2 SENSITIVE Sensitive     PIP/TAZO <=4 SENSITIVE Sensitive     * KLEBSIELLA PNEUMONIAE  Blood Culture ID Panel (Reflexed)     Status: Abnormal   Collection Time: 03/07/20 12:53 PM  Result Value Ref Range Status   Enterococcus faecalis NOT DETECTED NOT DETECTED Final   Enterococcus Faecium NOT DETECTED NOT DETECTED Final   Listeria monocytogenes NOT DETECTED NOT DETECTED Final   Staphylococcus species NOT DETECTED NOT DETECTED Final   Staphylococcus aureus (BCID) NOT DETECTED NOT DETECTED Final   Staphylococcus epidermidis NOT DETECTED NOT DETECTED Final   Staphylococcus lugdunensis NOT DETECTED NOT DETECTED Final   Streptococcus species NOT DETECTED NOT DETECTED Final   Streptococcus agalactiae NOT DETECTED NOT DETECTED Final   Streptococcus pneumoniae NOT DETECTED NOT DETECTED Final   Streptococcus pyogenes NOT DETECTED NOT DETECTED Final   A.calcoaceticus-baumannii NOT DETECTED NOT DETECTED Final   Bacteroides fragilis NOT DETECTED NOT DETECTED Final   Enterobacterales DETECTED (A) NOT DETECTED Final    Comment: Enterobacterales represent a large order of gram negative bacteria, not a single organism. CRITICAL RESULT CALLED TO, READ BACK BY AND VERIFIED WITH: J. LEDFORD,PHARMD [id number] 03/08/2020 T. TYSOR    Enterobacter cloacae complex NOT DETECTED NOT DETECTED Final   Escherichia coli NOT DETECTED NOT DETECTED Final   Klebsiella aerogenes NOT DETECTED  NOT DETECTED Final   Klebsiella oxytoca NOT DETECTED NOT DETECTED Final   Klebsiella pneumoniae DETECTED (A) NOT DETECTED Final    Comment: CRITICAL RESULT CALLED TO, READ  BACK BY AND VERIFIED WITH: J. LEDFORD,PHARMD [id number] 03/08/2020 T. TYSOR    Proteus species NOT DETECTED NOT DETECTED Final   Salmonella species NOT DETECTED NOT DETECTED Final   Serratia marcescens NOT DETECTED NOT DETECTED Final   Haemophilus influenzae NOT DETECTED NOT DETECTED Final   Neisseria meningitidis NOT DETECTED NOT DETECTED Final   Pseudomonas aeruginosa NOT DETECTED NOT DETECTED Final   Stenotrophomonas maltophilia NOT DETECTED NOT DETECTED Final   Candida albicans NOT DETECTED NOT DETECTED Final   Candida auris NOT DETECTED NOT DETECTED Final   Candida glabrata NOT DETECTED NOT DETECTED Final   Candida krusei NOT DETECTED NOT DETECTED Final   Candida parapsilosis NOT DETECTED NOT DETECTED Final   Candida tropicalis NOT DETECTED NOT DETECTED Final   Cryptococcus neoformans/gattii NOT DETECTED NOT DETECTED Final   CTX-M ESBL NOT DETECTED NOT DETECTED Final   Carbapenem resistance IMP NOT DETECTED NOT DETECTED Final   Carbapenem resistance KPC NOT DETECTED NOT DETECTED Final   Carbapenem resistance NDM NOT DETECTED NOT DETECTED Final   Carbapenem resist OXA 48 LIKE NOT DETECTED NOT DETECTED Final   Carbapenem resistance VIM NOT DETECTED NOT DETECTED Final    Comment: Performed at [facility], [address], [place], [place] [address]  Blood Culture (routine x 2)     Status: Abnormal   Collection Time: 03/07/20  1:22 PM   Specimen: BLOOD LEFT FOREARM  Result Value Ref Range Status   Specimen Description BLOOD LEFT FOREARM  Final   Special Requests   Final    BOTTLES DRAWN AEROBIC AND ANAEROBIC Blood Culture results may not be optimal due to an inadequate volume of blood received in culture bottles   Culture  Setup Time   Final    GRAM NEGATIVE RODS IN BOTH AEROBIC AND ANAEROBIC BOTTLES CRITICAL  RESULT CALLED TO, READ BACK BY AND VERIFIED WITH: J. LEDFORD,PHARMD [id number] 03/08/2020 T. TYSOR    Culture (A)  Final    KLEBSIELLA PNEUMONIAE SUSCEPTIBILITIES PERFORMED ON PREVIOUS CULTURE WITHIN THE LAST 5 DAYS. Performed at [facility], [place] [address] [place], [place] [address]    Report Status 03/11/2020 FINAL  Final  SARS Coronavirus 2 by RT PCR (hospital order, performed in [facility] hospital lab) Nasopharyngeal Nasopharyngeal Swab     Status: None   Collection Time: 03/07/20  1:22 PM   Specimen: Nasopharyngeal Swab  Result Value Ref Range Status   SARS Coronavirus 2 NEGATIVE NEGATIVE Final    Comment: (NOTE) SARS-CoV-2 target nucleic acids are NOT DETECTED.  The SARS-CoV-2 RNA is generally detectable in upper and lower respiratory specimens during the acute phase of infection. The lowest concentration of SARS-CoV-2 viral copies this assay can detect is 250 copies / mL. A negative result does not preclude SARS-CoV-2 infection and should not be used as the sole basis for treatment or other patient management decisions.  A negative result may occur with improper specimen collection / handling, submission of specimen other than nasopharyngeal swab, presence of viral mutation(s) within the areas targeted by this assay, and inadequate number of viral copies (<250 copies / mL). A negative result must be combined with clinical observations, patient history, and epidemiological information.  Fact Sheet for Patients:   StrictlyIdeas.no  Fact Sheet for Healthcare Providers: BankingDealers.co.za  This test is not yet approved or  cleared by the [place] FDA and has been authorized for detection and/or diagnosis of SARS-CoV-2 by FDA under an Emergency Use Authorization (EUA).  This EUA will  remain in effect (meaning this test can be used) for the duration of the COVID-19 declaration under Section 564(b)(1) of the Act, 21  U.S.C. section 360bbb-3(b)(1), unless the authorization is terminated or revoked sooner.  Performed at [facility], [place] [address] [place], [place] [address]   Urine culture     Status: None   Collection Time: 03/07/20  1:24 PM   Specimen: In/Out Cath Urine  Result Value Ref Range Status   Specimen Description IN/OUT CATH URINE  Final   Special Requests NONE  Final   Culture   Final    NO GROWTH Performed at [facility], [place] [address] [place], [place] [address]    Report Status 03/08/2020 FINAL  Final  MRSA PCR Screening     Status: None   Collection Time: 03/07/20 10:24 PM   Specimen: Nasal Mucosa; Nasopharyngeal  Result Value Ref Range Status   MRSA by PCR NEGATIVE NEGATIVE Final    Comment:        The GeneXpert MRSA Assay (FDA approved for NASAL specimens only), is one component of a comprehensive MRSA colonization surveillance program. It is not intended to diagnose MRSA infection nor to guide or monitor treatment for MRSA infections. Performed at [facility], [place] [address], [place], [place] [address]   Aerobic/Anaerobic Culture (surgical/deep wound)     Status: None (Preliminary result)   Collection Time: 03/09/20  3:57 PM   Specimen: Abscess  Result Value Ref Range Status   Specimen Description ABSCESS ABDOMEN  Final   Special Requests NONE  Final   Gram Stain   Final    RARE WBC PRESENT,BOTH PMN AND MONONUCLEAR NO ORGANISMS SEEN    Culture   Final    NO GROWTH 3 DAYS NO ANAEROBES ISOLATED; CULTURE IN PROGRESS FOR 5 DAYS Performed at [facility], [place] [address], [place], [place] [address]    Report Status PENDING  Incomplete  Surgical pcr screen     Status: None   Collection Time: 03/11/20  9:19 PM   Specimen: Nasal Mucosa; Nasal Swab  Result Value Ref Range Status   MRSA, PCR NEGATIVE NEGATIVE Final   Staphylococcus aureus NEGATIVE NEGATIVE Final    Comment: (NOTE) The Xpert SA Assay (FDA approved for NASAL specimens in  patients 67 years of age and older), is one component of a comprehensive surveillance program. It is not intended to diagnose infection nor to guide or monitor treatment. Performed at [facility], [place] [address], [place], [place] [address]   Aerobic/Anaerobic Culture (surgical/deep wound)     Status: None (Preliminary result)   Collection Time: 03/12/20  8:58 AM   Specimen: PATH Other; Tissue  Result Value Ref Range Status   Specimen Description TISSUE  Final   Special Requests INTRA ABDOMINAL SPEC 2  Final   Gram Stain   Final    RARE WBC PRESENT, PREDOMINANTLY PMN NO ORGANISMS SEEN Performed at [facility], [place] [address] [place],  [address]    Culture PENDING  Incomplete   Report Status PENDING  Incomplete         Radiology Studies: CT ABDOMEN PELVIS W CONTRAST  Result Date: 03/11/2020 CLINICAL DATA:  Abdominal distension, left upper quadrant fluid collection EXAM: CT ABDOMEN AND PELVIS WITH CONTRAST TECHNIQUE: Multidetector CT imaging of the abdomen and pelvis was performed using the standard protocol following bolus administration of intravenous contrast. CONTRAST:  134m OMNIPAQUE IOHEXOL 300 MG/ML  SOLN  COMPARISON:  03/07/2020, 03/08/2020, 03/09/2020 FINDINGS: Lower chest: There is progressive bilateral lower lobe atelectasis. Small bilateral pleural effusions are noted. Hepatobiliary: The dome of the right lobe of the liver is excluded by slice selection. Intrahepatic duct dilation is again identified, not appreciably changed. The gallbladder is decompressed. Common bile duct measures up to 10 mm in diameter. Pancreas: Mild dilatation of the pancreatic duct within the uncinate process unchanged. Otherwise the pancreas is unremarkable. Spleen: Normal in size without focal abnormality. Adrenals/Urinary Tract: Adrenal glands are unremarkable. Kidneys are normal, without renal calculi, focal lesion, or hydronephrosis. Bladder is unremarkable. Stomach/Bowel: No  bowel obstruction or ileus. Diverticulosis of the distal colon without diverticulitis. Normal appendix right lower quadrant. No bowel wall thickening or inflammatory change. Vascular/Lymphatic: No significant vascular findings are present. No enlarged abdominal or pelvic lymph nodes. Reproductive: Prostate is unremarkable. Other: Interval placement of a percutaneous drain within the left upper quadrant fluid collection. The inferior aspect of the fluid collection is near completely resolved after drainage. The more superior aspect of the fluid collection along the ventral aspect left lobe liver persists, measuring 2.9 x 5.9 cm in size. There does appear to be communication between the 2 fluid collections however. No evidence of pneumoperitoneum.  No abdominal wall hernia. Musculoskeletal: No acute or destructive bony lesions. Reconstructed images demonstrate no additional findings. IMPRESSION: 1. Interval placement of a pigtail drainage catheter within the inferior extent of a left upper quadrant fluid collection. While the inferior extent of the fluid collection adjacent to the percutaneous drain has near completely resolved, superior aspect of the fluid collection is actually slightly larger than previous evaluations. There does appear to be communication between the inferior and superior extent of the fluid collection however. Continued follow-up is recommended. 2. Persistent intrahepatic biliary duct dilation, with marked improvement in the common bile duct dilation seen previously. Findings consistent with interval ERCP and stone retrieval. 3. No bowel obstruction or ileus. Diverticulosis without diverticulitis. 4. Small bilateral pleural effusions, with progressive bilateral lower lobe atelectasis. Electronically Signed   By: Randa Ngo M.D.   On: 03/11/2020 17:18        Scheduled Meds: . acetaminophen  650 mg Oral Q6H  . Chlorhexidine Gluconate Cloth  6 each Topical Daily  . enoxaparin (LOVENOX)  injection  40 mg Subcutaneous Q24H  . mouth rinse  15 mL Mouth Rinse BID  . pantoprazole (PROTONIX) IV  40 mg Intravenous Q24H  . sodium chloride flush  5 mL Intracatheter Q8H   Continuous Infusions: . cefTRIAXone (ROCEPHIN)  IV 2 g (03/12/20 1820)  . lactated ringers Stopped (03/11/20 3893)  . magnesium sulfate bolus IVPB 2 g (03/13/20 0924)  . metronidazole 500 mg (03/13/20 0546)     LOS: 6 days   Time spent: 35 mins Greater than 50% of this time was spent in counseling, explanation of diagnosis, planning of further management, and coordination of care.  I have personally reviewed and interpreted on  03/13/2020 daily labs, imagings as discussed above under date review session and assessment and plans.  I reviewed all nursing notes, pharmacy notes, consultant notes,  vitals, pertinent old records  I have discussed plan of care as described above with RN , patient on 03/13/2020  Voice Recognition /Dragon dictation system was used to create this note, attempts have been made to correct errors. Please contact the author with questions and/or clarifications.   Florencia Reasons, MD PhD FACP [facility]  Available via Epic secure chat 7am-7pm for nonurgent issues Please  page for urgent issues To page the attending provider between 7A-7P or the covering provider during after hours 7P-7A, please log into the web site [email or web address] and access using universal [place] password for that web site. If you do not have the password, please call the hospital operator.    03/13/2020, 9:32 AM

## 2020-03-13 NOTE — Plan of Care (Signed)

## 2020-03-14 LAB — BASIC METABOLIC PANEL
Anion gap: 9 (ref 5–15)
BUN: 10 mg/dL (ref 8–23)
CO2: 23 mmol/L (ref 22–32)
Calcium: 7.2 mg/dL — ABNORMAL LOW (ref 8.9–10.3)
Chloride: 99 mmol/L (ref 98–111)
Creatinine, Ser: 0.75 mg/dL (ref 0.61–1.24)
GFR calc Af Amer: >60 mL/min (ref >60)
GFR calc non Af Amer: >60 mL/min (ref >60)
Glucose, Bld: 104 mg/dL — ABNORMAL HIGH (ref 70–99)
Potassium: 3.7 mmol/L (ref 3.5–5.1)
Sodium: 131 mmol/L — ABNORMAL LOW (ref 135–145)

## 2020-03-14 LAB — CBC WITH DIFFERENTIAL/PLATELET
Abs Immature Granulocytes: 0.47 10*3/uL — ABNORMAL HIGH (ref 0.00–0.07)
Basophils Absolute: 0.1 10*3/uL (ref 0.0–0.1)
Basophils Relative: 0 %
Eosinophils Absolute: 0.1 10*3/uL (ref 0.0–0.5)
Eosinophils Relative: 1 %
HCT: 31.9 % — ABNORMAL LOW (ref 39.0–52.0)
Hemoglobin: 10.9 g/dL — ABNORMAL LOW (ref 13.0–17.0)
Immature Granulocytes: 4 %
Lymphocytes Relative: 10 %
Lymphs Abs: 1.1 10*3/uL (ref 0.7–4.0)
MCH: 30.6 pg (ref 26.0–34.0)
MCHC: 34.2 g/dL (ref 30.0–36.0)
MCV: 89.6 fL (ref 80.0–100.0)
Monocytes Absolute: 0.9 10*3/uL (ref 0.1–1.0)
Monocytes Relative: 8 %
Neutro Abs: 9.1 10*3/uL — ABNORMAL HIGH (ref 1.7–7.7)
Neutrophils Relative %: 77 %
Platelets: 290 10*3/uL (ref 150–400)
RBC: 3.56 MIL/uL — ABNORMAL LOW (ref 4.22–5.81)
RDW: 13.9 % (ref 11.5–15.5)
WBC: 11.7 10*3/uL — ABNORMAL HIGH (ref 4.0–10.5)
nRBC: 0 % (ref 0.0–0.2)

## 2020-03-14 LAB — LIPASE, BLOOD: Lipase: 114 U/L — ABNORMAL HIGH (ref 11–51)

## 2020-03-14 LAB — MAGNESIUM: Magnesium: 1.6 mg/dL — ABNORMAL LOW (ref 1.7–2.4)

## 2020-03-14 MED ORDER — MAGNESIUM SULFATE 2 GM/50ML IV SOLN
2.0000 g | Freq: Once | INTRAVENOUS | Status: AC
  Administered 2020-03-14: 2 g via INTRAVENOUS
  Filled 2020-03-14: qty 50

## 2020-03-14 MED ORDER — POTASSIUM CHLORIDE CRYS ER 20 MEQ PO TBCR
40.0000 meq | EXTENDED_RELEASE_TABLET | Freq: Once | ORAL | Status: AC
  Administered 2020-03-14: 40 meq via ORAL
  Filled 2020-03-14: qty 2

## 2020-03-14 MED ORDER — AMOXICILLIN-POT CLAVULANATE 875-125 MG PO TABS
1.0000 | ORAL_TABLET | Freq: Two times a day (BID) | ORAL | Status: DC
  Administered 2020-03-14 – 2020-03-15 (×3): 1 via ORAL
  Filled 2020-03-14 (×3): qty 1

## 2020-03-14 MED ORDER — SACCHAROMYCES BOULARDII 250 MG PO CAPS
250.0000 mg | ORAL_CAPSULE | Freq: Two times a day (BID) | ORAL | Status: DC
  Administered 2020-03-14 – 2020-03-15 (×3): 250 mg via ORAL
  Filled 2020-03-14 (×3): qty 1

## 2020-03-14 MED ORDER — POLYETHYLENE GLYCOL 3350 17 G PO PACK
17.0000 g | PACK | Freq: Every day | ORAL | Status: AC
  Administered 2020-03-14 – 2020-03-15 (×2): 17 g via ORAL
  Filled 2020-03-14 (×2): qty 1

## 2020-03-14 NOTE — Progress Notes (Signed)
PROGRESS NOTE    Brandon Spence  HDQ:[id number] DOB: 22-Jun-1951 DOA: 03/07/2020 PCP: Patient, No Pcp Per    Chief Complaint  Patient presents with  . Dehydration    Brief Narrative:  Brandon Spence is a 69 year old male without known PMH who presents to ED 8/15 with dark urine following progressive feelings of general unwellness, weakness, fatigue x 3 days after prolonged sun exposure. He also reported severe cramping abdominal pain that started on 8/14. The pain was diffuse and intermittent.   In ED, met sepsis criteria and was resuscitated with 30cc/kg, started on empiric abx. Initial labs reveal myriad of metabolic derrangements including Na 123 K 3.3 Co2 17 Cr 1.98 AG 24 Alk phos 160 AST 275 ALT 434 Tbili 6.8. On exam he was jaundiced and sent for CT abdomen pelvis which revealed intra and extrahepatic biliary ductal dilation, CBD measuring 46m. There is a hypoecoic lesion in the pancreatic head measuring 2.8 x 1.2cm.   Patient is complaining of fatigue, belching and some abdominal pain. Blood pressure remained 80/60 despite fluid resuscitation.  He is started on pressors and admitted to ICU.  Significant Diagnostic Tests:  8/15 CT a/p noncon> intra and extrahepatic biliary ductal dilatation, common bile duct 227mwithout definite obstructing lesion. Pancreatic head with hypoechoic lesion 2.8cm x 1.2 cm (mass vs dilated portion of main pancreatic duct?) Moderate amount of fluid between stomach and liver.   Micro Data:  8/15 SARS Cov2 neg 8/15 BCx> 8/15 UA>  Antimicrobials:  8/15 vanc 8/15 cefepime  8/15 Flagyl>> 8/15 ceftriaxone>>   Subjective:  POD#2, no fever, pain has improved, tolerated diet, no bm since admitted, no n/v He has a JP drain on right side with serosanguineous drainage  Assessment & Plan:   Active Problems:   Pressure injury of skin   Abscess of abdominal cavity (HCC)   Hyperbilirubinemia   Dilated bile duct   Shock likely due to hypovolemia  and septic shock from biliary abdomen source, Klebsiella pneumonia bacteremia Present on admission -Is started on pressors and admitted to the ICU initially on admission , he is off pressor, transferred out of ICU to hospitalist service on August 19 -Blood culture positive for Klebsiella pneumoniae, abdominal drain culture no growth, SARS-CoV-2 screening negative, MRSA screening negative S/p cholecystectomy and debridement of intra-abdominal abscess August 20 -WBC trending down towards normal -He has been on Rocephin and Flagyl , antibiotic changed to Augmentin plan for a week of Augmentin treatment.  Gallstones /biliary ductal dilatation/possible CBD stone /abdominal fluid collection/lft elevation -Status post IR drain on August 18 -Status post ERCP on August 18, no CBD stone found, The ampulla had a small ulcer, possibly due to passed CBD stone.  Status post sphincterotomy -lft normalized  -repeat ct ab/pel on 8/19 with persistent fluid collection, persistent intrahepatic biliary ductal dilatation. -s/p Laparoscopic cholecystectomy, debridement of intra-abdominal abscess and drain placement on 8/20 -Management per general surgery  AKI -BUN 43/creatinine 1.98 on presentation -Likely from dehydration and sepsis, CK also mildly elevated on presentation 528, CK trending down on repeat -Renal function normalized since August 18  Normocytic anemia -Hemoglobin  Around 11 -Monitor  Hyponatremia -Sodium 123 on presentation -Likely from dehydration, he drinks 1 can of beer daily prior to hospitalization -Improving, sodium 131   Hypokalemia hypomagnesemia, Remain low, continue replace Recheck in the morning  Thrombocytopenia , likely reactive due to sepsis nadir at 129, platelet normalized on August 19  Elevated fasting blood glucose, possibly stress-induced A1c 5.8 A.m. blood  glucose 91 this morning  Constipation: Started Senokot on August 21, add on MiraLAX today, encourage  ambulation  DVT prophylaxis: enoxaparin (LOVENOX) injection 40 mg Start: 03/10/20 1100 SCDs Start: 03/07/20 1645   Code Status: DNR Family Communication: Patient Disposition:   Status is: Inpatient  Dispo: The patient is from: Home              Anticipated d/c is to: Home              Anticipated d/c date is: August 23             Is not comfortable going home today on August 22 due to no BM since hospitalization, and has not learned how to manage the drain, he agrees to go home tomorrow  Consultants:   Critical care admission, transfer to hospitalist on August 19  GI  General surgery  IR  Procedures:   IR drain placement on August 18  ERCP with sphincterotomy  on August 18 s/p Laparoscopic cholecystectomy, debridement of intra-abdominal abscess and drain placement on 8/20  Antimicrobials:   8/15 vanc 8/15 cefepime  8/15 Flagyl>> 8/22 8/15 ceftriaxone>>8/22  Augmentin from August 22, plan for total a week of Augmentin treatment     Objective: Vitals:   03/13/20 1333 03/13/20 2035 03/14/20 0500 03/14/20 0527  BP: 107/70 117/80  131/84  Pulse: 68 62  70  Resp: '19 16  17  ' Temp: (!) 97.3 F (36.3 C) 98.5 F (36.9 C)  98.3 F (36.8 C)  TempSrc: Oral Oral  Oral  SpO2: 96% 96%  97%  Weight:   90.7 kg   Height:        Intake/Output Summary (Last 24 hours) at 03/14/2020 0901 Last data filed at 03/14/2020 0851 Gross per 24 hour  Intake 1377.12 ml  Output 2805 ml  Net -1427.88 ml   Filed Weights   03/10/20 1334 03/12/20 0500 03/14/20 0500  Weight: 90.1 kg 93 kg 90.7 kg    Examination:  General exam: calm, NAD Respiratory system: Clear to auscultation. Respiratory effort normal. Cardiovascular system: S1 & S2 heard, RRR. No JVD, no murmur, No pedal edema. Gastrointestinal system: Drain to right upper quadrant, abdomen is nondistended, positive bowel sounds. Central nervous system: Alert and oriented. No focal neurological deficits. Extremities:  Symmetric 5 x 5 power. Skin: No rashes, lesions or ulcers Psychiatry: Judgement and insight appear normal. Mood & affect appropriate.     Data Reviewed: I have personally reviewed following labs and imaging studies  CBC: Recent Labs  Lab 03/07/20 1051 03/07/20 1637 03/11/20 1103 03/11/20 1417 03/13/20 0419 03/13/20 0849 03/14/20 0115  WBC 8.2   < > 11.7* 11.7* 16.0* 16.1* 11.7*  NEUTROABS 7.5  --   --  8.6*  --  12.6* 9.1*  HGB 14.6   < > 11.8* 11.0* 11.9* 11.9* 10.9*  HCT 44.0   < > 34.9* 32.8* 34.5* 34.8* 31.9*  MCV 90.7   < > 90.4 89.4 90.3 88.5 89.6  PLT 287   < > 159 147* 266 287 290   < > = values in this interval not displayed.    Basic Metabolic Panel: Recent Labs  Lab 03/08/20 0055 03/08/20 1930 03/09/20 0826 03/10/20 0749 03/11/20 1103 03/12/20 0345 03/13/20 0419 03/14/20 0115  NA   < >  --   --  132* 132* 131* 133* 131*  K   < >  --   --  3.3* 3.2* 4.1 3.9 3.7  CL   < >  --   --  99 97* 97* 99 99  CO2   < >  --   --  '25 25 24 24 23  ' GLUCOSE   < >  --   --  130* 108* 91 117* 104*  BUN   < >  --   --  '13 12 12 13 10  ' CREATININE   < >  --   --  0.78 0.81 0.76 0.81 0.75  CALCIUM   < >  --   --  7.6* 7.5* 7.2* 7.5* 7.2*  MG  --  1.6* 1.8  --   --  1.4* 1.8 1.6*   < > = values in this interval not displayed.    GFR: Estimated Creatinine Clearance: 98.3 mL/min (by C-G formula based on SCr of 0.75 mg/dL).  Liver Function Tests: Recent Labs  Lab 03/07/20 2256 03/09/20 0048 03/10/20 0749 03/11/20 1103 03/13/20 0419  AST 137* 60* 30 17 14*  ALT 231* 146* 93* 57* 33  ALKPHOS 112 83 78 62 53  BILITOT 3.1* 1.7* 1.5* 1.2 0.6  PROT 5.2* 5.4* 5.3* 5.5* 5.5*  ALBUMIN 2.2* 2.0* 2.1* 2.2* 2.1*    CBG: Recent Labs  Lab 03/07/20 2220  GLUCAP 98     Recent Results (from the past 240 hour(s))  Blood Culture (routine x 2)     Status: Abnormal   Collection Time: 03/07/20 12:53 PM   Specimen: BLOOD  Result Value Ref Range Status   Specimen Description  BLOOD RIGHT ANTECUBITAL  Final   Special Requests   Final    BOTTLES DRAWN AEROBIC AND ANAEROBIC Blood Culture adequate volume   Culture  Setup Time   Final    GRAM NEGATIVE RODS IN BOTH AEROBIC AND ANAEROBIC BOTTLES CRITICAL RESULT CALLED TO, READ BACK BY AND VERIFIED WITH: Serita Grammes [id number] 03/08/2020 Mena Goes Performed at [facility], [address], [place], [place] [address]    Culture KLEBSIELLA PNEUMONIAE (A)  Final   Report Status 03/11/2020 FINAL  Final   Organism ID, Bacteria KLEBSIELLA PNEUMONIAE  Final      Susceptibility   Klebsiella pneumoniae - MIC*    AMPICILLIN RESISTANT Resistant     CEFAZOLIN <=4 SENSITIVE Sensitive     CEFEPIME <=0.12 SENSITIVE Sensitive     CEFTAZIDIME <=1 SENSITIVE Sensitive     CEFTRIAXONE <=0.25 SENSITIVE Sensitive     CIPROFLOXACIN <=0.25 SENSITIVE Sensitive     GENTAMICIN <=1 SENSITIVE Sensitive     IMIPENEM <=0.25 SENSITIVE Sensitive     TRIMETH/SULFA <=20 SENSITIVE Sensitive     AMPICILLIN/SULBACTAM <=2 SENSITIVE Sensitive     PIP/TAZO <=4 SENSITIVE Sensitive     * KLEBSIELLA PNEUMONIAE  Blood Culture ID Panel (Reflexed)     Status: Abnormal   Collection Time: 03/07/20 12:53 PM  Result Value Ref Range Status   Enterococcus faecalis NOT DETECTED NOT DETECTED Final   Enterococcus Faecium NOT DETECTED NOT DETECTED Final   Listeria monocytogenes NOT DETECTED NOT DETECTED Final   Staphylococcus species NOT DETECTED NOT DETECTED Final   Staphylococcus aureus (BCID) NOT DETECTED NOT DETECTED Final   Staphylococcus epidermidis NOT DETECTED NOT DETECTED Final   Staphylococcus lugdunensis NOT DETECTED NOT DETECTED Final   Streptococcus species NOT DETECTED NOT DETECTED Final   Streptococcus agalactiae NOT DETECTED NOT DETECTED Final   Streptococcus pneumoniae NOT DETECTED NOT DETECTED Final   Streptococcus pyogenes NOT DETECTED NOT DETECTED Final   A.calcoaceticus-baumannii NOT DETECTED NOT DETECTED Final   Bacteroides fragilis  NOT DETECTED NOT DETECTED  Final   Enterobacterales DETECTED (A) NOT DETECTED Final    Comment: Enterobacterales represent a large order of gram negative bacteria, not a single organism. CRITICAL RESULT CALLED TO, READ BACK BY AND VERIFIED WITH: J. LEDFORD,PHARMD [id number] 03/08/2020 T. TYSOR    Enterobacter cloacae complex NOT DETECTED NOT DETECTED Final   Escherichia coli NOT DETECTED NOT DETECTED Final   Klebsiella aerogenes NOT DETECTED NOT DETECTED Final   Klebsiella oxytoca NOT DETECTED NOT DETECTED Final   Klebsiella pneumoniae DETECTED (A) NOT DETECTED Final    Comment: CRITICAL RESULT CALLED TO, READ BACK BY AND VERIFIED WITH: J. LEDFORD,PHARMD [id number] 03/08/2020 T. TYSOR    Proteus species NOT DETECTED NOT DETECTED Final   Salmonella species NOT DETECTED NOT DETECTED Final   Serratia marcescens NOT DETECTED NOT DETECTED Final   Haemophilus influenzae NOT DETECTED NOT DETECTED Final   Neisseria meningitidis NOT DETECTED NOT DETECTED Final   Pseudomonas aeruginosa NOT DETECTED NOT DETECTED Final   Stenotrophomonas maltophilia NOT DETECTED NOT DETECTED Final   Candida albicans NOT DETECTED NOT DETECTED Final   Candida auris NOT DETECTED NOT DETECTED Final   Candida glabrata NOT DETECTED NOT DETECTED Final   Candida krusei NOT DETECTED NOT DETECTED Final   Candida parapsilosis NOT DETECTED NOT DETECTED Final   Candida tropicalis NOT DETECTED NOT DETECTED Final   Cryptococcus neoformans/gattii NOT DETECTED NOT DETECTED Final   CTX-M ESBL NOT DETECTED NOT DETECTED Final   Carbapenem resistance IMP NOT DETECTED NOT DETECTED Final   Carbapenem resistance KPC NOT DETECTED NOT DETECTED Final   Carbapenem resistance NDM NOT DETECTED NOT DETECTED Final   Carbapenem resist OXA 48 LIKE NOT DETECTED NOT DETECTED Final   Carbapenem resistance VIM NOT DETECTED NOT DETECTED Final    Comment: Performed at [facility], [address] [place], [place] [address]  Blood Culture (routine x 2)      Status: Abnormal   Collection Time: 03/07/20  1:22 PM   Specimen: BLOOD LEFT FOREARM  Result Value Ref Range Status   Specimen Description BLOOD LEFT FOREARM  Final   Special Requests   Final    BOTTLES DRAWN AEROBIC AND ANAEROBIC Blood Culture results may not be optimal due to an inadequate volume of blood received in culture bottles   Culture  Setup Time   Final    GRAM NEGATIVE RODS IN BOTH AEROBIC AND ANAEROBIC BOTTLES CRITICAL RESULT CALLED TO, READ BACK BY AND VERIFIED WITH: J. LEDFORD,PHARMD [id number] 03/08/2020 T. TYSOR    Culture (A)  Final    KLEBSIELLA PNEUMONIAE SUSCEPTIBILITIES PERFORMED ON PREVIOUS CULTURE WITHIN THE LAST 5 DAYS. Performed at [facility], [place] [address] [place],  [address]    Report Status 03/11/2020 FINAL  Final  SARS Coronavirus 2 by RT PCR (hospital order, performed in [facility] hospital lab) Nasopharyngeal Nasopharyngeal Swab     Status: None   Collection Time: 03/07/20  1:22 PM   Specimen: Nasopharyngeal Swab  Result Value Ref Range Status   SARS Coronavirus 2 NEGATIVE NEGATIVE Final    Comment: (NOTE) SARS-CoV-2 target nucleic acids are NOT DETECTED.  The SARS-CoV-2 RNA is generally detectable in upper and lower respiratory specimens during the acute phase of infection. The lowest concentration of SARS-CoV-2 viral copies this assay can detect is 250 copies / mL. A negative result does not preclude SARS-CoV-2 infection and should not be used as the sole basis for treatment or other patient management decisions.  A negative result may occur with improper  specimen collection / handling, submission of specimen other than nasopharyngeal swab, presence of viral mutation(s) within the areas targeted by this assay, and inadequate number of viral copies (<250 copies / mL). A negative result must be combined with clinical observations, patient history, and epidemiological information.  Fact Sheet for Patients:     StrictlyIdeas.no  Fact Sheet for Healthcare Providers: BankingDealers.co.za  This test is not yet approved or  cleared by the [place] FDA and has been authorized for detection and/or diagnosis of SARS-CoV-2 by FDA under an Emergency Use Authorization (EUA).  This EUA will remain in effect (meaning this test can be used) for the duration of the COVID-19 declaration under Section 564(b)(1) of the Act, 21 U.S.C. section 360bbb-3(b)(1), unless the authorization is terminated or revoked sooner.  Performed at [facility], [place] [address] [place], [place] [address]   Urine culture     Status: None   Collection Time: 03/07/20  1:24 PM   Specimen: In/Out Cath Urine  Result Value Ref Range Status   Specimen Description IN/OUT CATH URINE  Final   Special Requests NONE  Final   Culture   Final    NO GROWTH Performed at [facility], [place] [address] [place], [place] [address]    Report Status 03/08/2020 FINAL  Final  MRSA PCR Screening     Status: None   Collection Time: 03/07/20 10:24 PM   Specimen: Nasal Mucosa; Nasopharyngeal  Result Value Ref Range Status   MRSA by PCR NEGATIVE NEGATIVE Final    Comment:        The GeneXpert MRSA Assay (FDA approved for NASAL specimens only), is one component of a comprehensive MRSA colonization surveillance program. It is not intended to diagnose MRSA infection nor to guide or monitor treatment for MRSA infections. Performed at [facility], [place] [address], [place], [place] [address]   Aerobic/Anaerobic Culture (surgical/deep wound)     Status: None (Preliminary result)   Collection Time: 03/09/20  3:57 PM   Specimen: Abscess  Result Value Ref Range Status   Specimen Description ABSCESS ABDOMEN  Final   Special Requests NONE  Final   Gram Stain   Final    RARE WBC PRESENT,BOTH PMN AND MONONUCLEAR NO ORGANISMS SEEN    Culture   Final    NO GROWTH 4 DAYS NO ANAEROBES  ISOLATED; CULTURE IN PROGRESS FOR 5 DAYS Performed at [facility], [place] [address] [place], [place] [address]    Report Status PENDING  Incomplete  Surgical pcr screen     Status: None   Collection Time: 03/11/20  9:19 PM   Specimen: Nasal Mucosa; Nasal Swab  Result Value Ref Range Status   MRSA, PCR NEGATIVE NEGATIVE Final   Staphylococcus aureus NEGATIVE NEGATIVE Final    Comment: (NOTE) The Xpert SA Assay (FDA approved for NASAL specimens in patients 13 years of age and older), is one component of a comprehensive surveillance program. It is not intended to diagnose infection nor to guide or monitor treatment. Performed at [facility], [place] [address] [place], [place] [address]   Aerobic/Anaerobic Culture (surgical/deep wound)     Status: None (Preliminary result)   Collection Time: 03/12/20  8:58 AM   Specimen: PATH Other; Tissue  Result Value Ref Range Status   Specimen Description TISSUE  Final   Special Requests INTRA ABDOMINAL SPEC 2  Final   Gram Stain   Final    RARE WBC  PRESENT, PREDOMINANTLY PMN NO ORGANISMS SEEN    Culture   Final    NO GROWTH 1 DAY Performed at [place] [address], [place], [place] [address]    Report Status PENDING  Incomplete  Acid Fast Smear (AFB)     Status: None   Collection Time: 03/12/20  8:58 AM   Specimen: PATH Other; Tissue  Result Value Ref Range Status   AFB Specimen Processing Concentration  Final   Acid Fast Smear Negative  Final    Comment: (NOTE) Performed At: [facility] [place], [place] [id number] Rush Farmer MD RF:[id number]    Source (AFB) TISSUE  Final    Comment: INTRA ABDOMINAL SPEC 2 Performed at [facility], [place] [address], [place], [place] [address]          Radiology Studies: No results found.      Scheduled Meds: . acetaminophen  650 mg Oral Q6H  . amoxicillin-clavulanate  1 tablet Oral Q12H  . Chlorhexidine Gluconate Cloth  6 each  Topical Daily  . enoxaparin (LOVENOX) injection  40 mg Subcutaneous Q24H  . mouth rinse  15 mL Mouth Rinse BID  . pantoprazole (PROTONIX) IV  40 mg Intravenous Q24H  . polyethylene glycol  17 g Oral Daily  . potassium chloride  40 mEq Oral Once  . saccharomyces boulardii  250 mg Oral BID  . senna-docusate  1 tablet Oral BID  . sodium chloride flush  5 mL Intracatheter Q8H   Continuous Infusions: . magnesium sulfate bolus IVPB       LOS: 7 days   Time spent: 35 mins Greater than 50% of this time was spent in counseling, explanation of diagnosis, planning of further management, and coordination of care.  I have personally reviewed and interpreted on  03/14/2020 daily labs, imagings as discussed above under date review session and assessment and plans.  I reviewed all nursing notes, pharmacy notes, consultant notes,  vitals, pertinent old records  I have discussed plan of care as described above with RN , patient on 03/14/2020  Voice Recognition /Dragon dictation system was used to create this note, attempts have been made to correct errors. Please contact the author with questions and/or clarifications.   Florencia Reasons, MD PhD FACP [facility]  Available via Epic secure chat 7am-7pm for nonurgent issues Please page for urgent issues To page the attending provider between 7A-7P or the covering provider during after hours 7P-7A, please log into the web site [email or web address] and access using universal [place] password for that web site. If you do not have the password, please call the hospital operator.    03/14/2020, 9:01 AM

## 2020-03-14 NOTE — Progress Notes (Addendum)
[facility]:  [phone] General Surgery Progress Note   LOS: 7 days  POD -  2 Days Post-Op  Assessment and Plan: 1.  LAPAROSCOPIC CHOLECYSTECTOMY IRRIGATION AND DEBRIDEMENT ABDOMINAL ABSCESS - 8/20 - Brandon Spence  Rocephin/Flagyl -   WBC - 11,700 - 03/13/2020  On reg food.  He could go home from our standpoint today, but he wants to stay another day.  His JP drain will need to stay in a week from surgery.  We will see him the office to remove this.  He will need antibiotics at least one week from the date of surgery.  He needs to ambulate more.   2.  ERCP - 8/19 - [place] 3.  DVT prophylaxis - Lovenox 4.  Not interested in the Covid vaccine   Active Problems:   Pressure injury of skin   Abscess of abdominal cavity (HCC)   Hyperbilirubinemia   Dilated bile duct  Subjective:  Feels better.  PO intake okay.    He lives by self.  He is unemployed right now.  Objective:   Vitals:   03/13/20 2035 03/14/20 0527  BP: 117/80 131/84  Pulse: 62 70  Resp: 16 17  Temp: 98.5 F (36.9 C) 98.3 F (36.8 C)  SpO2: 96% 97%     Intake/Output from previous day:  08/21 0701 - 08/22 0700 In: 1377.1 [P.O.:900; IV Piggyback:477.1] Out: 2935 [Urine:2775; Drains:160]  Intake/Output this shift:  Total I/O In: -  Out: 200 [Urine:200]   Physical Exam:   General: WN WM who is alert and oriented.    HEENT: Normal. Pupils equal. .   Lungs: Clear.  IS = 750 cc   Abdomen: H BS.  No localized tenderness.  Drain in RUQ - 160 cc recorded last 24 hours.   Wound: Clean   Lab Results:    Recent Labs    03/13/20 0849 03/14/20 0115  WBC 16.1* 11.7*  HGB 11.9* 10.9*  HCT 34.8* 31.9*  PLT 287 290    BMET   Recent Labs    03/13/20 0419 03/14/20 0115  NA 133* 131*  K 3.9 3.7  CL 99 99  CO2 24 23  GLUCOSE 117* 104*  BUN 13 10  CREATININE 0.81 0.75  CALCIUM 7.5* 7.2*    PT/INR  No results for input(s): LABPROT, INR in the last 72 hours.  ABG  No results for  input(s): PHART, HCO3 in the last 72 hours.  Invalid input(s): PCO2, PO2   Studies/Results:  No results found.   Anti-infectives:   Anti-infectives (From admission, onward)   Start     Dose/Rate Route Frequency Ordered Stop   03/08/20 1400  vancomycin (VANCOREADY) IVPB 1250 mg/250 mL  Status:  Discontinued        1,250 mg 166.7 mL/hr over 90 Minutes Intravenous Every 24 hours 03/07/20 1320 03/07/20 1746   03/08/20 1300  vancomycin (VANCOREADY) IVPB 1250 mg/250 mL  Status:  Discontinued        1,250 mg 166.7 mL/hr over 90 Minutes Intravenous Every 24 hours 03/07/20 1309 03/07/20 1320   03/08/20 0100  ceFEPIme (MAXIPIME) 2 g in sodium chloride 0.9 % 100 mL IVPB  Status:  Discontinued        2 g 200 mL/hr over 30 Minutes Intravenous Every 12 hours 03/07/20 1309 03/07/20 1745   03/07/20 2130  metroNIDAZOLE (FLAGYL) IVPB 500 mg        500 mg 100 mL/hr over 60 Minutes Intravenous Every 8 hours 03/07/20 1744  03/07/20 1745  cefTRIAXone (ROCEPHIN) 2 g in sodium chloride 0.9 % 100 mL IVPB        2 g 200 mL/hr over 30 Minutes Intravenous Every 24 hours 03/07/20 1744     03/07/20 1315  vancomycin (VANCOREADY) IVPB 1500 mg/300 mL        1,500 mg 150 mL/hr over 120 Minutes Intravenous  Once 03/07/20 1309 03/07/20 1630   03/07/20 1300  ceFEPIme (MAXIPIME) 2 g in sodium chloride 0.9 % 100 mL IVPB        2 g 200 mL/hr over 30 Minutes Intravenous  Once 03/07/20 1254 03/07/20 1446   03/07/20 1300  metroNIDAZOLE (FLAGYL) IVPB 500 mg        500 mg 100 mL/hr over 60 Minutes Intravenous  Once 03/07/20 1254 03/07/20 1445   03/07/20 1300  vancomycin (VANCOCIN) IVPB 1000 mg/200 mL premix  Status:  Discontinued        1,000 mg 200 mL/hr over 60 Minutes Intravenous  Once 03/07/20 1254 03/07/20 1302      Ovidio Kin, MD, [facility] Surgery Office: [phone] 03/14/2020

## 2020-03-15 ENCOUNTER — Encounter (HOSPITAL_COMMUNITY): Payer: Self-pay | Admitting: Surgery

## 2020-03-15 DIAGNOSIS — F1010 Alcohol abuse, uncomplicated: Secondary | ICD-10-CM

## 2020-03-15 DIAGNOSIS — R7881 Bacteremia: Secondary | ICD-10-CM

## 2020-03-15 DIAGNOSIS — N179 Acute kidney failure, unspecified: Secondary | ICD-10-CM

## 2020-03-15 DIAGNOSIS — E871 Hypo-osmolality and hyponatremia: Secondary | ICD-10-CM

## 2020-03-15 DIAGNOSIS — A419 Sepsis, unspecified organism: Secondary | ICD-10-CM

## 2020-03-15 LAB — AEROBIC/ANAEROBIC CULTURE W GRAM STAIN (SURGICAL/DEEP WOUND): Culture: NO GROWTH

## 2020-03-15 LAB — CBC WITH DIFFERENTIAL/PLATELET
Abs Immature Granulocytes: 0.42 10*3/uL — ABNORMAL HIGH (ref 0.00–0.07)
Basophils Absolute: 0.1 10*3/uL (ref 0.0–0.1)
Basophils Relative: 1 %
Eosinophils Absolute: 0.1 10*3/uL (ref 0.0–0.5)
Eosinophils Relative: 1 %
HCT: 33 % — ABNORMAL LOW (ref 39.0–52.0)
Hemoglobin: 11.2 g/dL — ABNORMAL LOW (ref 13.0–17.0)
Immature Granulocytes: 4 %
Lymphocytes Relative: 14 %
Lymphs Abs: 1.5 10*3/uL (ref 0.7–4.0)
MCH: 30.9 pg (ref 26.0–34.0)
MCHC: 33.9 g/dL (ref 30.0–36.0)
MCV: 90.9 fL (ref 80.0–100.0)
Monocytes Absolute: 0.9 10*3/uL (ref 0.1–1.0)
Monocytes Relative: 8 %
Neutro Abs: 7.9 10*3/uL — ABNORMAL HIGH (ref 1.7–7.7)
Neutrophils Relative %: 72 %
Platelets: 357 10*3/uL (ref 150–400)
RBC: 3.63 MIL/uL — ABNORMAL LOW (ref 4.22–5.81)
RDW: 14.2 % (ref 11.5–15.5)
WBC: 10.9 10*3/uL — ABNORMAL HIGH (ref 4.0–10.5)
nRBC: 0 % (ref 0.0–0.2)

## 2020-03-15 LAB — BASIC METABOLIC PANEL
Anion gap: 9 (ref 5–15)
BUN: 9 mg/dL (ref 8–23)
CO2: 22 mmol/L (ref 22–32)
Calcium: 7.7 mg/dL — ABNORMAL LOW (ref 8.9–10.3)
Chloride: 102 mmol/L (ref 98–111)
Creatinine, Ser: 0.78 mg/dL (ref 0.61–1.24)
GFR calc Af Amer: >60 mL/min (ref >60)
GFR calc non Af Amer: >60 mL/min (ref >60)
Glucose, Bld: 111 mg/dL — ABNORMAL HIGH (ref 70–99)
Potassium: 4.3 mmol/L (ref 3.5–5.1)
Sodium: 133 mmol/L — ABNORMAL LOW (ref 135–145)

## 2020-03-15 LAB — MAGNESIUM: Magnesium: 1.9 mg/dL (ref 1.7–2.4)

## 2020-03-15 LAB — HEPATIC FUNCTION PANEL
ALT: 23 U/L (ref 0–44)
AST: 15 U/L (ref 15–41)
Albumin: 2.2 g/dL — ABNORMAL LOW (ref 3.5–5.0)
Alkaline Phosphatase: 49 U/L (ref 38–126)
Bilirubin, Direct: 0.3 mg/dL — ABNORMAL HIGH (ref 0.0–0.2)
Indirect Bilirubin: 0.4 mg/dL (ref 0.3–0.9)
Total Bilirubin: 0.7 mg/dL (ref 0.3–1.2)
Total Protein: 5.7 g/dL — ABNORMAL LOW (ref 6.5–8.1)

## 2020-03-15 LAB — SURGICAL PATHOLOGY

## 2020-03-15 LAB — LIPASE, BLOOD: Lipase: 140 U/L — ABNORMAL HIGH (ref 11–51)

## 2020-03-15 MED ORDER — SENNOSIDES-DOCUSATE SODIUM 8.6-50 MG PO TABS: 1.0000 | ORAL_TABLET | Freq: Two times a day (BID) | ORAL | 1 refills | Status: AC

## 2020-03-15 MED ORDER — MAGNESIUM HYDROXIDE 400 MG/5ML PO SUSP
15.0000 mL | Freq: Every day | ORAL | Status: DC | PRN
  Administered 2020-03-15: 15 mL via ORAL
  Filled 2020-03-15: qty 30

## 2020-03-15 MED ORDER — DOCUSATE SODIUM 100 MG PO CAPS: 100.0000 mg | ORAL_CAPSULE | Freq: Two times a day (BID) | ORAL | 0 refills | Status: AC | PRN

## 2020-03-15 MED ORDER — PANTOPRAZOLE SODIUM 40 MG PO TBEC: 40.0000 mg | DELAYED_RELEASE_TABLET | Freq: Every day | ORAL | Status: DC

## 2020-03-15 MED ORDER — SACCHAROMYCES BOULARDII 250 MG PO CAPS: 250.0000 mg | ORAL_CAPSULE | Freq: Two times a day (BID) | ORAL | 0 refills | Status: AC

## 2020-03-15 MED ORDER — AMOXICILLIN-POT CLAVULANATE 875-125 MG PO TABS: 1.0000 | ORAL_TABLET | Freq: Two times a day (BID) | ORAL | 0 refills | Status: AC

## 2020-03-15 MED ORDER — MAGNESIUM HYDROXIDE 400 MG/5ML PO SUSP: 15.0000 mL | Freq: Every day | ORAL | 0 refills | Status: AC | PRN

## 2020-03-15 MED ORDER — ONDANSETRON HCL 4 MG/2ML IJ SOLN: 4.0000 mg | Freq: Four times a day (QID) | INTRAMUSCULAR | 0 refills | Status: AC | PRN

## 2020-03-15 NOTE — Progress Notes (Signed)
Discharge instructions reviewed at bedside with patient.  Patient verbalized understanding and declined further education.  Patient escorted by staff off unit for discharge.

## 2020-03-15 NOTE — Progress Notes (Signed)
Patient given supplies to complete dressing change and drain care.  Patient observed to complete care correctly.

## 2020-03-15 NOTE — Discharge Summary (Addendum)
Physician Discharge Summary  Brandon Spence DDU:[id number] DOB: Mar 02, 1951 DOA: 03/07/2020  PCP: Patient, No Pcp Per  Admit date: 03/07/2020 Discharge date: 03/22/2020 Consultations: General surgery, gastroenterology Admitted From: home Disposition: home  Discharge Diagnoses:  Principal Problem:   Severe sepsis with septic shock [facility]) Active Problems:   Bacteremia due to Gram-negative bacteria   Acute on chronic cholecystitis   AKI (acute kidney injury) ([place])   Abscess of abdominal cavity ([place])   Hyperbilirubinemia   Dilated bile duct   Hyponatremia   Alcohol abuse   Hospital Course Summary: 69yo male without known PMH who presented to [facility] 8/15 complaining of several days of weakness/fatigue, fever, and abdominal pain.  Patient reported left upper quadrant abdominal pain onset about 6 weeks prior but got worse in 2 to 3 weeks prior to admission. It became worse and more frequent x2-3 weeks, and 2 days prior to admission the pain was severe. Pain was associated with fatigue and fever, which he thought was due to sun poisoning. Denies ever having any RUQ pain, nausea, or vomiting.In the ED he was found to be septic, lactic acidosis 8.5, AKI Cr 1.98. CK 528. LFTs elevated AST 275, ALT 434, Alk phos 160, Tbili 6.8. CT abdomen/pelvis revealed intra and extrahepatic biliary ductal dilatation with the common bile duct measuring up to 21 mm in diameter, moderate amount of fluid between the stomach and the liver, and possible lesion on the pancreatic head.  He was admitted to the ICU with pressors and started on broad spectrum antibiotics.    1. Severe sepsis with septic shock, Klebsiella pneumonia bacteremia due to Severe acute on chronic cholecystitis: Present on admission secondary to intra-abdominal source.MRCP obtained and reports cholelithiasis, mild diffuse gallbladder wall thickening, no pericholecystic fluid or gallbladder distension; intrahepatic biliary ductal dilatation with diffuse  periportal edema and dilated common bile duct 31m with suggestion of a 522mstone in the common bile duct; mild dilatation of the ventral pancreatic duct and no discrete pancreatic mass; lobulated loculated 7.2 x 2.8 x 12.0 cm fluid collection in the anterior left upper peritoneal cavity with mild wall thickening and enhancement.Blood culture found to be positive for klebsiella pneumoniae.IR consulted and placed a drain in the left upper abdominal fluid collection, aspirated bile.GI took the patient for ERCP on 8/18 and found the entire main bile duct was markedly dilated, a biliary sphincterotomy was performed, the biliary tree was swept and nothing was found.GI was concerned about potential gallbladder perforation. General surgery consulted and patient underwent laparoscopic cholecystectomy/debridement of intra-abdominal abscess on 8/20 ( purulent rind/gelatinous purulent collection along the anterior peritoneum extending from the mid epigastrium over the superior surface of the left lobe of the liver noted per GS note ) with drain placement on 03/12/2020.  Patient managed with IV Rocephin and Flagyl initially, WBC trended down and now transitioned to Augmentin for another week since date of surgery.  Patient comfortable with drain care at home.  Seen by general surgery and cleared for discharge today.  Patient denies any complaints of ongoing abdominal pain and does not feel he needs any pain medications upon discharge.   2.  Acute renal failure: Secondary to dehydration versus sepsis.  BUN 43/creatinine 1.98 on presentation-likely related to problem #1. CK also mildly elevated on presentation 528, CK trending down on repeat labs.Renal function normalized since August 18 with IV fluids/IV antibiotics.  3.  Hyponatremia: Likely hypovolemic hyponatremia secondary to dehydration.  Might have a component of beer potomania as well.  Sodium  was 123 on presentation, slowly improved to 133 prior to discharge.  4.   Hypokalemia, hypomagnesemia: Replace and recheck in a.m.  5.  Thrombocytopenia: In the setting of sepsis.  Platelets dropped to 129.  Was transiently normalized with treatment of sepsis.  6.  Constipation: Improved with MiraLAX and Senokot.  Discharge Exam:   Vitals:   03/14/20 1427 03/14/20 2035 03/15/20 0632 03/15/20 1303  BP: 124/86 102/68 (!) 141/79 113/75  Pulse: 60 62 68 60  Resp: 18   16  Temp: 98.4 F (36.9 C) 98 F (36.7 C) 97.8 F (36.6 C) 98.3 F (36.8 C)  TempSrc: Oral Oral Oral Oral  SpO2: 97% 96% 99% 98%  Weight:   91.2 kg   Height:        General: Pt is alert, awake, not in acute distress Cardiovascular: RRR, S1/S2 +, no rubs, no gallops Respiratory: CTA bilaterally, no wheezing, no rhonchi Abdominal: Soft, abdominal drain in place with minimal sanguinous drainage noted.  NT, ND, bowel sounds + Extremities: no edema, no cyanosis  Discharge Condition:Stable CODE STATUS: DNR Diet recommendation: Low-fat Recommendations for Outpatient Follow-up:  1. Follow up with PCP: 1 week 2. Follow up with consultants: General surgery/drain clinic as scheduled.  GI clinic if needed 3. Please obtain follow up labs including: CMP in 1 week  [place] services upon discharge: None at this time Equipment/Devices upon discharge: Drain care supply at times   Discharge Instructions:  Discharge Instructions    Call MD for:  difficulty breathing, headache or visual disturbances   Complete by: As directed    Call MD for:  extreme fatigue   Complete by: As directed    Call MD for:  persistant dizziness or light-headedness   Complete by: As directed    Call MD for:  persistant nausea and vomiting   Complete by: As directed    Call MD for:  redness, tenderness, or signs of infection (pain, swelling, redness, odor or green/yellow discharge around incision site)   Complete by: As directed    Call MD for:  severe uncontrolled pain   Complete by: As directed    Call MD for:   temperature >100.4   Complete by: As directed    Change dressing (specify)   Complete by: As directed    Dressing change: daily   Diet - low sodium heart healthy   Complete by: As directed    Increase activity slowly   Complete by: As directed      Allergies as of 03/15/2020   No Known Allergies     Medication List    TAKE these medications   docusate sodium 100 MG capsule Commonly known as: COLACE Take 1 capsule (100 mg total) by mouth 2 (two) times daily as needed for mild constipation.   magnesium hydroxide 400 MG/5ML suspension Commonly known as: MILK OF MAGNESIA Take 15 mLs by mouth daily as needed for moderate constipation.   ondansetron 4 MG/2ML Soln injection Commonly known as: ZOFRAN Inject 2 mLs (4 mg total) into the vein every 6 (six) hours as needed for nausea.   saccharomyces boulardii 250 MG capsule Commonly known as: FLORASTOR Take 1 capsule (250 mg total) by mouth 2 (two) times daily for 15 days.   senna-docusate 8.6-50 MG tablet Commonly known as: Senokot-S Take 1 tablet by mouth 2 (two) times daily.     ASK your doctor about these medications   amoxicillin-clavulanate 875-125 MG tablet Commonly known as: AUGMENTIN Take 1 tablet by  mouth every 12 (twelve) hours for 6 days. Ask about: Should I take this medication?            Discharge Care Instructions  (From admission, onward)         Start     Ordered   03/15/20 0000  Change dressing (specify)       Comments: Dressing change: daily   03/15/20 1657          Follow-up Information    [place]. Schedule an appointment as soon as possible for a visit.   Contact information: [place] [phone] [phone]       Carlena Hurl, PA-C Follow up on 04/01/2020.   Specialty: General Surgery Why: 2:00pm, arrive by 1:45pm for paperwork and check in process Contact information: [place]  [address] [phone]        [facility] [place] Follow up on 03/22/2020.   Specialty: General Surgery Why: 11 am arrive by 10:30am for paperwork and check in process. This is a nurse visit to remove your surgical drain Contact information: [place] [address] [phone]              No Known Allergies    The results of significant diagnostics from this hospitalization (including imaging, microbiology, ancillary and laboratory) are listed below for reference.    Labs: BNP (last 3 results) No results for input(s): BNP in the last 8760 hours. Basic Metabolic Panel: No results for input(s): NA, K, CL, CO2, GLUCOSE, BUN, CREATININE, CALCIUM, MG, PHOS in the last 168 hours. Liver Function Tests: No results for input(s): AST, ALT, ALKPHOS, BILITOT, PROT, ALBUMIN in the last 168 hours. No results for input(s): LIPASE, AMYLASE in the last 168 hours. No results for input(s): AMMONIA in the last 168 hours. CBC: No results for input(s): WBC, NEUTROABS, HGB, HCT, MCV, PLT in the last 168 hours. Cardiac Enzymes: No results for input(s): CKTOTAL, CKMB, CKMBINDEX, TROPONINI in the last 168 hours. BNP: Invalid input(s): POCBNP CBG: No results for input(s): GLUCAP in the last 168 hours. D-Dimer No results for input(s): DDIMER in the last 72 hours. Hgb A1c No results for input(s): HGBA1C in the last 72 hours. Lipid Profile No results for input(s): CHOL, HDL, LDLCALC, TRIG, CHOLHDL, LDLDIRECT in the last 72 hours. Thyroid function studies No results for input(s): TSH, T4TOTAL, T3FREE, THYROIDAB in the last 72 hours.  Invalid input(s): FREET3 Anemia work up No results for input(s): VITAMINB12, FOLATE, FERRITIN, TIBC, IRON, RETICCTPCT in the last 72 hours. Urinalysis    Component Value Date/Time   COLORURINE AMBER (A) 03/07/2020 1322   APPEARANCEUR HAZY (A) 03/07/2020 1322   LABSPEC 1.015 03/07/2020 1322   PHURINE 5.0 03/07/2020 1322   GLUCOSEU  NEGATIVE 03/07/2020 1322   HGBUR MODERATE (A) 03/07/2020 1322   BILIRUBINUR NEGATIVE 03/07/2020 1322   KETONESUR NEGATIVE 03/07/2020 1322   PROTEINUR 30 (A) 03/07/2020 1322   NITRITE NEGATIVE 03/07/2020 1322   LEUKOCYTESUR NEGATIVE 03/07/2020 1322   Sepsis Labs Invalid input(s): PROCALCITONIN,  WBC,  LACTICIDVEN Microbiology No results found for this or any previous visit (from the past 240 hour(s)).  Procedures/Studies: CT Abdomen Pelvis Wo Contrast  Result Date: 03/07/2020 CLINICAL DATA:  Dark urine. EXAM: CT ABDOMEN AND PELVIS WITHOUT CONTRAST TECHNIQUE: Multidetector CT imaging of the abdomen and pelvis was performed following the standard protocol without IV contrast. COMPARISON:  Same day chest radiograph. FINDINGS:  Lower chest: Moderate bibasilar atelectasis is noted. Hepatobiliary: No focal liver abnormality is seen. There is intra and extrahepatic biliary ductal dilatation with the common bile duct measuring up to 21 mm in diameter. No definite obstructing lesion is identified. No gallstones or gallbladder wall thickening. Pancreas: A hypoechoic lesion in the pancreatic head measures 2.8 x 1.2 cm and may represent a mass in the pancreatic head or a dilated portion of the main pancreatic duct (series 6, image 36). The pancreatic duct within the body and tail appears normal given technique. No inflammatory changes are noted surrounding the pancreas. Spleen: Normal in size without focal abnormality. Adrenals/Urinary Tract: Adrenal glands are unremarkable. Kidneys are normal, without renal calculi, focal lesion, or hydronephrosis. Bladder is unremarkable. Stomach/Bowel: Stomach is within normal limits. Appendix appears normal. No evidence of bowel wall thickening, distention, or inflammatory changes. Vascular/Lymphatic: Aortic atherosclerosis. No enlarged abdominal or pelvic lymph nodes. Reproductive: Prostate is unremarkable. Other: No abdominal wall hernia or abnormality. A moderate amount of  fluid is seen between the stomach and the liver (series 3, image 21) and inferior to the stomach (series 3, image 35), likely located within the omentum. Musculoskeletal: Degenerative changes are seen in the spine. IMPRESSION: 1. Intra and extrahepatic biliary ductal dilatation with the common bile duct measuring up to 21 mm in diameter. No definite obstructing lesion is identified. 2. A hypoechoic lesion in the pancreatic head measures 2.8 x 1.2 cm and may represent a mass in the pancreatic head or a dilated portion of the main pancreatic duct. This could be further evaluated with MRCP. 3. A moderate amount of fluid between the stomach and the liver is likely located within the omentum and is nonspecific. Aortic Atherosclerosis (ICD10-I70.0). Electronically Signed   By: Zerita Boers M.D.   On: 03/07/2020 14:36   DG Chest 2 View  Result Date: 03/07/2020 CLINICAL DATA:  Patient with heat stroke.  Headache. EXAM: CHEST - 2 VIEW COMPARISON:  None. FINDINGS: Normal cardiac and mediastinal contours. Low lung volumes. Bibasilar heterogeneous opacities. No pleural effusion or pneumothorax. Thoracic spine degenerative changes. IMPRESSION: Low lung volumes with bibasilar atelectasis. Electronically Signed   By: Lovey Newcomer M.D.   On: 03/07/2020 12:19   CT ABDOMEN PELVIS W CONTRAST  Result Date: 03/11/2020 CLINICAL DATA:  Abdominal distension, left upper quadrant fluid collection EXAM: CT ABDOMEN AND PELVIS WITH CONTRAST TECHNIQUE: Multidetector CT imaging of the abdomen and pelvis was performed using the standard protocol following bolus administration of intravenous contrast. CONTRAST:  133m OMNIPAQUE IOHEXOL 300 MG/ML  SOLN COMPARISON:  03/07/2020, 03/08/2020, 03/09/2020 FINDINGS: Lower chest: There is progressive bilateral lower lobe atelectasis. Small bilateral pleural effusions are noted. Hepatobiliary: The dome of the right lobe of the liver is excluded by slice selection. Intrahepatic duct dilation is again  identified, not appreciably changed. The gallbladder is decompressed. Common bile duct measures up to 10 mm in diameter. Pancreas: Mild dilatation of the pancreatic duct within the uncinate process unchanged. Otherwise the pancreas is unremarkable. Spleen: Normal in size without focal abnormality. Adrenals/Urinary Tract: Adrenal glands are unremarkable. Kidneys are normal, without renal calculi, focal lesion, or hydronephrosis. Bladder is unremarkable. Stomach/Bowel: No bowel obstruction or ileus. Diverticulosis of the distal colon without diverticulitis. Normal appendix right lower quadrant. No bowel wall thickening or inflammatory change. Vascular/Lymphatic: No significant vascular findings are present. No enlarged abdominal or pelvic lymph nodes. Reproductive: Prostate is unremarkable. Other: Interval placement of a percutaneous drain within the left upper quadrant fluid collection. The inferior aspect of  the fluid collection is near completely resolved after drainage. The more superior aspect of the fluid collection along the ventral aspect left lobe liver persists, measuring 2.9 x 5.9 cm in size. There does appear to be communication between the 2 fluid collections however. No evidence of pneumoperitoneum.  No abdominal wall hernia. Musculoskeletal: No acute or destructive bony lesions. Reconstructed images demonstrate no additional findings. IMPRESSION: 1. Interval placement of a pigtail drainage catheter within the inferior extent of a left upper quadrant fluid collection. While the inferior extent of the fluid collection adjacent to the percutaneous drain has near completely resolved, superior aspect of the fluid collection is actually slightly larger than previous evaluations. There does appear to be communication between the inferior and superior extent of the fluid collection however. Continued follow-up is recommended. 2. Persistent intrahepatic biliary duct dilation, with marked improvement in the  common bile duct dilation seen previously. Findings consistent with interval ERCP and stone retrieval. 3. No bowel obstruction or ileus. Diverticulosis without diverticulitis. 4. Small bilateral pleural effusions, with progressive bilateral lower lobe atelectasis. Electronically Signed   By: Randa Ngo M.D.   On: 03/11/2020 17:18   DG ERCP BILIARY & PANCREATIC DUCTS  Result Date: 03/11/2020 CLINICAL DATA:  Bile duct stone. EXAM: ERCP TECHNIQUE: Multiple spot images obtained with the fluoroscopic device and submitted for interpretation post-procedure. FLUOROSCOPY TIME:  Fluoroscopy Time:  2 minutes and 43 seconds Radiation Exposure Index (if provided by the fluoroscopic device): 31.84 mGy COMPARISON:  MRI 03/08/2020 FINDINGS: Retrograde cholangiogram demonstrates a dilated biliary system. Wire was advanced into a left hepatic duct. Questionable small filling defect in the distal common bile duct. Evidence for a balloon sweep. Percutaneous drain in the left upper abdomen. IMPRESSION: 1. Dilated biliary system. 2. Questionable filling defect in the distal common bile duct could represent a small stone. Evidence for balloon sweep. 3. Patent cystic duct. These images were submitted for radiologic interpretation only. Please see the procedural report for the amount of contrast and the fluoroscopy time utilized. Electronically Signed   By: Markus Daft M.D.   On: 03/11/2020 08:17   MR ABDOMEN MRCP W WO CONTAST  Result Date: 03/08/2020 CLINICAL DATA:  Inpatient. Jaundice. Fatigue. Abdominal pain. Dark urine. Sepsis. Biliary dilatation on noncontrast CT from 1 day prior. EXAM: MRI ABDOMEN WITHOUT AND WITH CONTRAST (INCLUDING MRCP) TECHNIQUE: Multiplanar multisequence MR imaging of the abdomen was performed both before and after the administration of intravenous contrast. Heavily T2-weighted images of the biliary and pancreatic ducts were obtained, and three-dimensional MRCP images were rendered by post processing.  3D MRCP sequence could not be performed due to tachypnea and erratic hiccups. CONTRAST:  8.16m GADAVIST GADOBUTROL 1 MMOL/ML IV SOLN COMPARISON:  03/07/2020 unenhanced CT abdomen/pelvis. FINDINGS: Significantly motion degraded scan, limiting assessment. Lower chest: Prominent atelectasis at the dependent lung bases bilaterally. Hepatobiliary: Normal liver size and configuration. No hepatic steatosis. No liver mass. Nondistended gallbladder contains multiple layering gallstones up to 1.0 cm in diameter. Mild diffuse gallbladder wall thickening. No significant pericholecystic fluid. Diffuse mild-to-moderate intrahepatic biliary ductal dilatation with diffuse periportal edema. Dilated common bile duct (14 mm diameter). There is a suggestion of a 5 mm choledocholith in the lower third of the CBD (series 12/image 20). No discrete enhancing biliary mass. No bile duct beading. Pancreas: No discrete pancreatic mass. Mild dilatation of ventral pancreatic duct in the pancreatic head (4-5 mm diameter) with normal caliber main pancreatic duct. No pancreas divisum. Spleen: Normal size. No mass. Adrenals/Urinary Tract: Normal adrenals.  No hydronephrosis. Mild renal cortical scarring in the lower left kidney. No renal masses. Stomach/Bowel: Normal non-distended stomach. Visualized small and large bowel is normal caliber, with no bowel wall thickening. Vascular/Lymphatic: Normal caliber abdominal aorta. Patent portal, splenic, hepatic and renal veins. Mildly enlarged porta hepatis nodes up to 1.6 cm (series 1302/image 69). No additional pathologically enlarged lymph nodes in the abdomen. Other: There is a lobulated loculated 7.2 x 2.8 x 12.0 cm fluid collection in the anterior left upper peritoneal cavity just to the left of midline scalloping the anterior liver contour in the lateral segment left liver lobe, demonstrating mild wall thickening and enhancement. No additional fluid collections or ascites. Musculoskeletal: No  aggressive appearing focal osseous lesions. IMPRESSION: 1. Limited motion degraded scan. 2. Cholelithiasis. Mild diffuse gallbladder wall thickening. No significant pericholecystic fluid. No gallbladder distention. 3. Diffuse mild-to-moderate intrahepatic biliary ductal dilatation with diffuse periportal edema. Dilated common bile duct (14 mm diameter) with suggestion of a 5 mm choledocholith in the lower third of the CBD. 4. Mild dilatation of the ventral pancreatic duct in the pancreatic head. No discrete pancreatic mass. 5. Lobulated loculated 7.2 x 2.8 x 12.0 cm fluid collection in the anterior left upper peritoneal cavity with mild wall thickening and enhancement, scalloping the anterior liver contour in the lateral segment left liver lobe, cannot exclude an infected collection. 6. Mild porta hepatis lymphadenopathy, nonspecific. 7. Prominent atelectasis at the dependent lung bases bilaterally. Electronically Signed   By: Ilona Sorrel M.D.   On: 03/08/2020 15:23   CT IMAGE GUIDED DRAINAGE BY PERCUTANEOUS CATHETER  Result Date: 03/10/2020 INDICATION: 69 year old male with abdominal fluid collection. EXAM: CT GUIDED DRAINAGE OF ABDOMINAL ABSCESS MEDICATIONS: The patient is currently admitted to the hospital and receiving intravenous antibiotics. The antibiotics were administered within an appropriate time frame prior to the initiation of the procedure. ANESTHESIA/SEDATION: 1.0 mg IV Versed 50 mcg IV Fentanyl Moderate Sedation Time:  15 minutes The patient was continuously monitored during the procedure by the interventional radiology nurse under my direct supervision. COMPLICATIONS: None TECHNIQUE: Informed written consent was obtained from the patient after a thorough discussion of the procedural risks, benefits and alternatives. All questions were addressed. Maximal Sterile Barrier Technique was utilized including caps, mask, sterile gowns, sterile gloves, sterile drape, hand hygiene and skin antiseptic. A  timeout was performed prior to the initiation of the procedure. PROCEDURE: The operative field was prepped with chlorhexidine in a sterile fashion, and a sterile drape was applied covering the operative field. A sterile gown and sterile gloves were used for the procedure. Local anesthesia was provided with 1% Lidocaine. Patient was positioned supine position on the CT gantry table. Scout CT was acquired for planning purposes. The patient was then prepped and draped in the usual sterile fashion. 1% lidocaine was used for local anesthesia. Using CT guidance, trocar needle was advanced into the fluid collection of the left abdomen. Modified Seldinger technique was then used to place a 12 [place] biliary drain with additional sideholes into the fluid collection. Fluid was aspirated and sent for culture and total bilirubin level. Final image was acquired. Catheter was sutured in position and attached to bulb suction. Patient tolerated the procedure well and remained hemodynamically stable throughout. No complications were encountered and no significant blood loss. FINDINGS: Similar configuration of dumbbell shaped fluid within the left abdomen. Dark green bilious fluid was aspirated and a culture and T bili fluid were sent. IMPRESSION: Status post CT-guided drainage of left abdominal fluid collection.  Signed, Dulcy Fanny. Dellia Nims, RPVI [facility] Electronically Signed   By: Corrie Mckusick D.O.   On: 03/10/2020 09:30    Time coordinating discharge: Over 30 minutes  SIGNED:   Gu[place]ford Shi, MD  [facility] 03/22/2020, 1:19 PM

## 2020-03-15 NOTE — Progress Notes (Signed)
[facility] Progress Note  3 Days Post-Op  Subjective: CC-  Up in chair eating breakfast. States that he feels great and is ready to go home. Mild abdominal soreness around drain. He is not taking any pain medication. Denies n/v. Passing flatus, no BM. WBC down 10.9, afebrile  Objective: Vital signs in last 24 hours: Temp:  [97.8 F (36.6 C)-98.4 F (36.9 C)] 97.8 F (36.6 C) (08/23 7253) Pulse Rate:  [60-68] 68 (08/23 0632) Resp:  [18] 18 (08/22 1427) BP: (102-141)/(68-86) 141/79 (08/23 0632) SpO2:  [96 %-99 %] 99 % (08/23 6644) Weight:  [91.2 kg] 91.2 kg (08/23 0347) Last BM Date:  (PTA)  Intake/Output from previous day: 08/22 0701 - 08/23 0700 In: 120 [P.O.:120] Out: 290 [Urine:200; Drains:90] Intake/Output this shift: No intake/output data recorded.  PE: Gen:  Alert, NAD, pleasant Pulm: rate and effort normal Abd: Soft, NT/ND, +BS, lap incisions C/D/I, drain with minimal serosanguinous drainage  Lab Results:  Recent Labs    03/14/20 0115 03/15/20 0227  WBC 11.7* 10.9*  HGB 10.9* 11.2*  HCT 31.9* 33.0*  PLT 290 357   BMET Recent Labs    03/14/20 0115 03/15/20 0227  NA 131* 133*  K 3.7 4.3  CL 99 102  CO2 23 22  GLUCOSE 104* 111*  BUN 10 9  CREATININE 0.75 0.78  CALCIUM 7.2* 7.7*   PT/INR No results for input(s): LABPROT, INR in the last 72 hours. CMP     Component Value Date/Time   NA 133 (L) 03/15/2020 0227   K 4.3 03/15/2020 0227   CL 102 03/15/2020 0227   CO2 22 03/15/2020 0227   GLUCOSE 111 (H) 03/15/2020 0227   BUN 9 03/15/2020 0227   CREATININE 0.78 03/15/2020 0227   CALCIUM 7.7 (L) 03/15/2020 0227   PROT 5.7 (L) 03/15/2020 0227   ALBUMIN 2.2 (L) 03/15/2020 0227   AST 15 03/15/2020 0227   ALT 23 03/15/2020 0227   ALKPHOS 49 03/15/2020 0227   BILITOT 0.7 03/15/2020 0227   GFRNONAA >60 03/15/2020 0227   GFRAA >60 03/15/2020 0227   Lipase     Component Value Date/Time   LIPASE 140 (H) 03/15/2020 0227        Studies/Results: No results found.  Anti-infectives: Anti-infectives (From admission, onward)   Start     Dose/Rate Route Frequency Ordered Stop   03/14/20 1000  amoxicillin-clavulanate (AUGMENTIN) 875-125 MG per tablet 1 tablet        1 tablet Oral Every 12 hours 03/14/20 0900     03/08/20 1400  vancomycin (VANCOREADY) IVPB 1250 mg/250 mL  Status:  Discontinued        1,250 mg 166.7 mL/hr over 90 Minutes Intravenous Every 24 hours 03/07/20 1320 03/07/20 1746   03/08/20 1300  vancomycin (VANCOREADY) IVPB 1250 mg/250 mL  Status:  Discontinued        1,250 mg 166.7 mL/hr over 90 Minutes Intravenous Every 24 hours 03/07/20 1309 03/07/20 1320   03/08/20 0100  ceFEPIme (MAXIPIME) 2 g in sodium chloride 0.9 % 100 mL IVPB  Status:  Discontinued        2 g 200 mL/hr over 30 Minutes Intravenous Every 12 hours 03/07/20 1309 03/07/20 1745   03/07/20 2130  metroNIDAZOLE (FLAGYL) IVPB 500 mg  Status:  Discontinued        500 mg 100 mL/hr over 60 Minutes Intravenous Every 8 hours 03/07/20 1744 03/14/20 0900   03/07/20 1745  cefTRIAXone (ROCEPHIN) 2 g in sodium chloride 0.9 %  100 mL IVPB  Status:  Discontinued        2 g 200 mL/hr over 30 Minutes Intravenous Every 24 hours 03/07/20 1744 03/14/20 0900   03/07/20 1315  vancomycin (VANCOREADY) IVPB 1500 mg/300 mL        1,500 mg 150 mL/hr over 120 Minutes Intravenous  Once 03/07/20 1309 03/07/20 1630   03/07/20 1300  ceFEPIme (MAXIPIME) 2 g in sodium chloride 0.9 % 100 mL IVPB        2 g 200 mL/hr over 30 Minutes Intravenous  Once 03/07/20 1254 03/07/20 1446   03/07/20 1300  metroNIDAZOLE (FLAGYL) IVPB 500 mg        500 mg 100 mL/hr over 60 Minutes Intravenous  Once 03/07/20 1254 03/07/20 1445   03/07/20 1300  vancomycin (VANCOCIN) IVPB 1000 mg/200 mL premix  Status:  Discontinued        1,000 mg 200 mL/hr over 60 Minutes Intravenous  Once 03/07/20 1254 03/07/20 1302       Assessment/Plan  Severe acute on chronic cholecystitis,  purulent rind/gelatinous purulent collection along the anterior peritoneum extending from the mid epigastrium over the superior surface of the left lobe of the liver -Laparoscopic cholecystectomy, debridement of intra-abdominal abscess 8/20 Dr. Fredricka Bonine - POD#3 - Ok for discharge from surgical standpoint. Diet as tolerated, continue bowel regimen at home. Continue JP drain at discharge. Recommend at least 1 week of antibiotics from the date of surgery. Discharge instructions and follow up info on AVS.  ID - rocephin/flagyl 8/15>>8/22, augmentin 8/22>>day#2 VTE - SCDs, lovenox FEN - IVF, reg diet Foley - none Follow up - drain check, [facility]    LOS: 8 days    Franne Forts, [facility] Surgery 03/15/2020, 9:03 AM Please see Amion for pager number during day hours 7:00am-4:30pm

## 2020-03-16 NOTE — Anesthesia Postprocedure Evaluation (Signed)
Anesthesia Post Note  Patient: Brandon Spence  Procedure(s) Performed: LAPAROSCOPIC CHOLECYSTECTOMY (N/A Abdomen) IRRIGATION AND DEBRIDEMENT ABDOMINAL ABSCESS (N/A Abdomen)     Patient location during evaluation: PACU Anesthesia Type: General Level of consciousness: awake and alert Pain management: pain level controlled Vital Signs Assessment: post-procedure vital signs reviewed and stable Respiratory status: spontaneous breathing, nonlabored ventilation, respiratory function stable and patient connected to nasal cannula oxygen Cardiovascular status: blood pressure returned to baseline and stable Postop Assessment: no apparent nausea or vomiting Anesthetic complications: no   No complications documented.  Last Vitals:  Vitals:   03/15/20 0632 03/15/20 1303  BP: (!) 141/79 113/75  Pulse: 68 60  Resp:  16  Temp: 36.6 C 36.8 C  SpO2: 99% 98%    Last Pain:  Vitals:   03/15/20 1303  TempSrc: Oral  PainSc:                  Franca Stakes

## 2020-03-17 LAB — AEROBIC/ANAEROBIC CULTURE W GRAM STAIN (SURGICAL/DEEP WOUND): Culture: NO GROWTH

## 2020-03-22 DIAGNOSIS — K812 Acute cholecystitis with chronic cholecystitis: Secondary | ICD-10-CM

## 2020-04-13 LAB — FUNGUS CULTURE RESULT

## 2020-04-13 LAB — FUNGUS CULTURE WITH STAIN

## 2020-04-13 LAB — FUNGAL ORGANISM REFLEX

## 2020-04-26 LAB — ACID FAST CULTURE WITH REFLEXED SENSITIVITIES (MYCOBACTERIA): Acid Fast Culture: NEGATIVE

## 2021-06-26 IMAGING — CT CT IMAGE GUIDED DRAINAGE BY PERCUTANEOUS CATHETER
2 of 4 series · 12 of 32 positions shown, 18 images · non-contrast
Comparison: none

INDICATION: 69 year old male with abdominal fluid collection.
TECHNIQUE: Informed written consent was obtained from the patient after a
thorough discussion of the procedural risks, benefits and
alternatives. All questions were addressed. Maximal Sterile Barrier
Technique was utilized including caps, mask, sterile gowns, sterile
gloves, sterile drape, hand hygiene and skin antiseptic. A timeout
was performed prior to the initiation of the procedure.

[Series 2: i-spiral 5.0 b40f · axial · 0.95mm/px · z∈[+706,+1025]mm · 10 of 113 slices shown, 16 images (1 of 2)]
[im 11/113  soft-tissue]
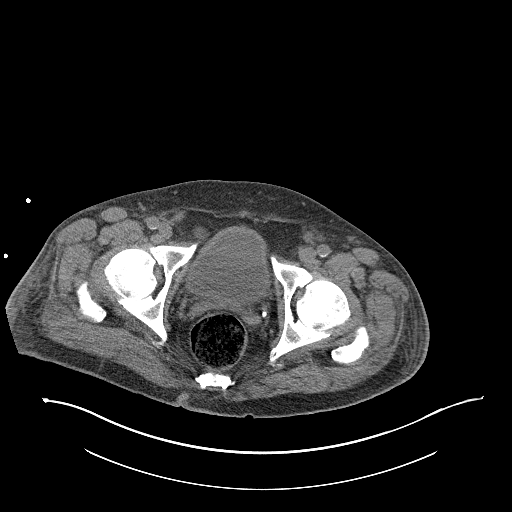
[im 11/113  bone]
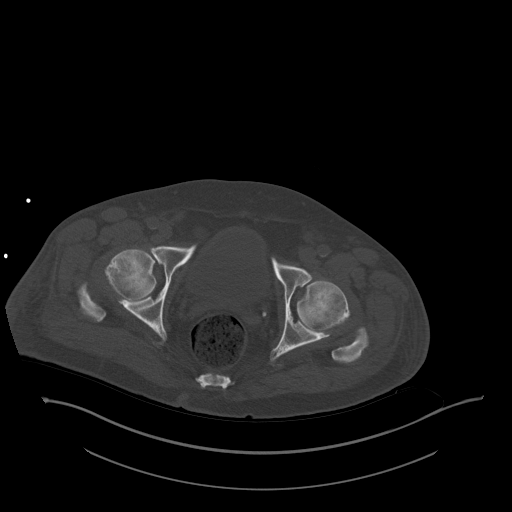
[im 21/113  soft-tissue]
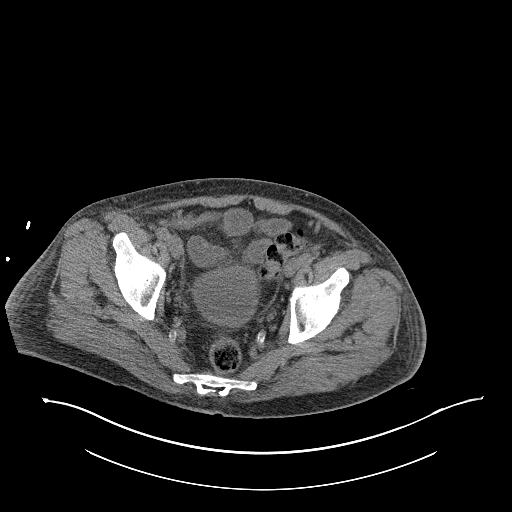
[im 31/113  soft-tissue]
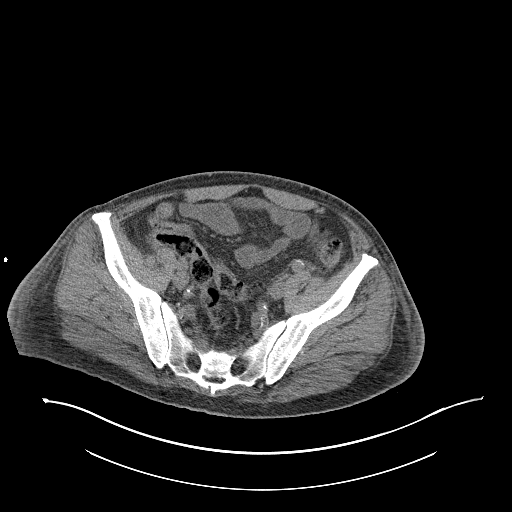
[im 41/113  soft-tissue]
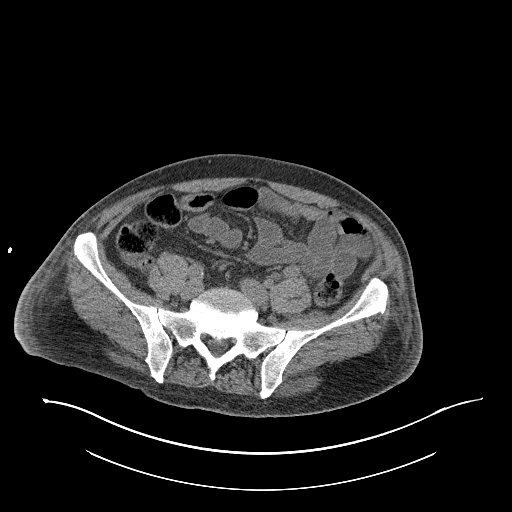
[im 51/113  soft-tissue]
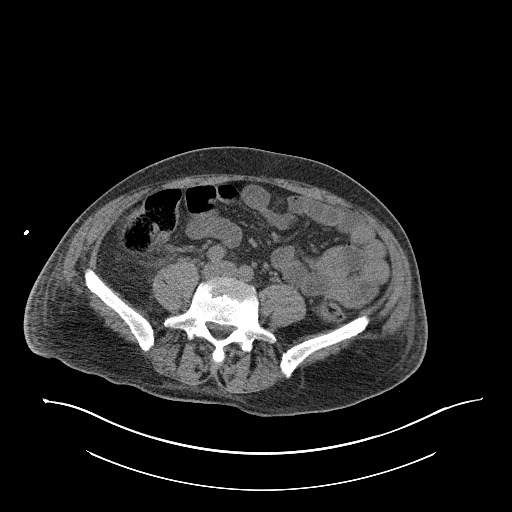
[im 62/113  soft-tissue]
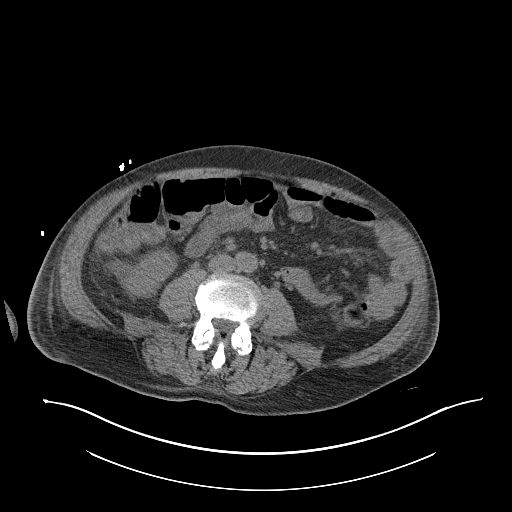
[im 72/113  soft-tissue]
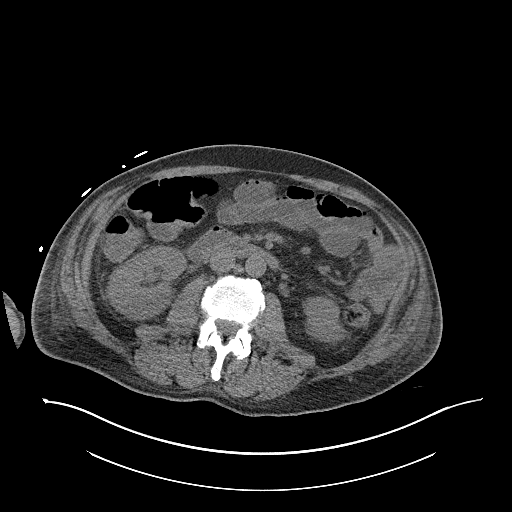
[im 72/113  lung]
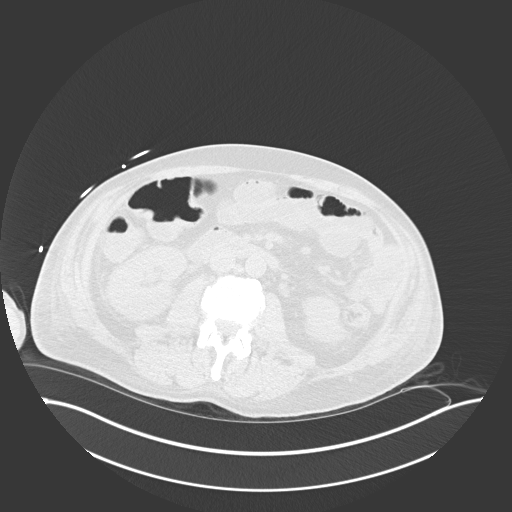
[im 82/113  soft-tissue]
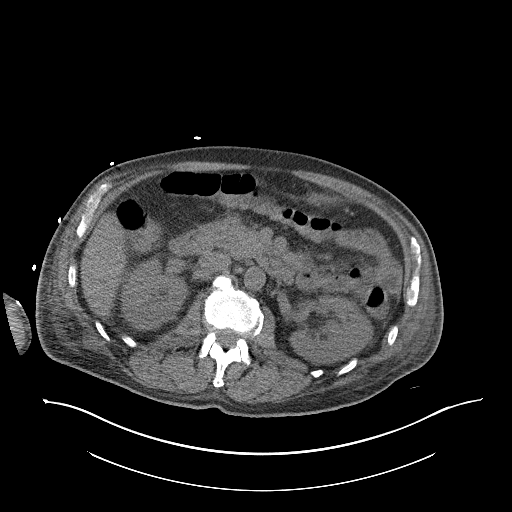
[im 82/113  lung]
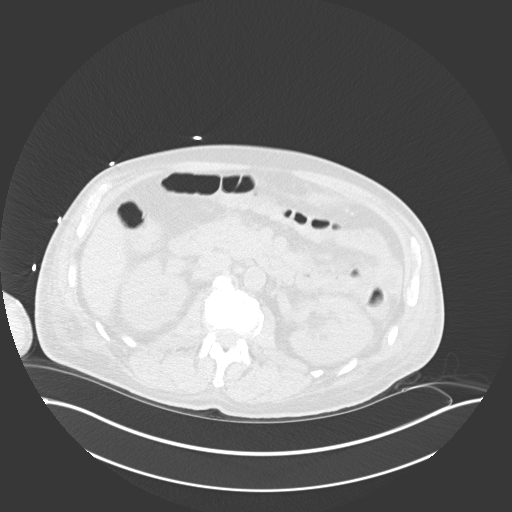
[im 92/113  soft-tissue]
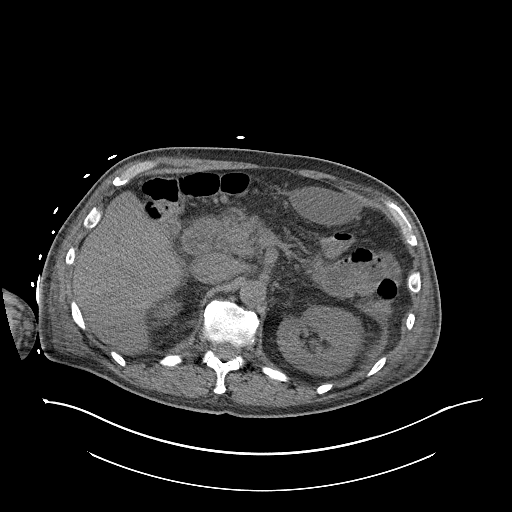
[im 92/113  lung]
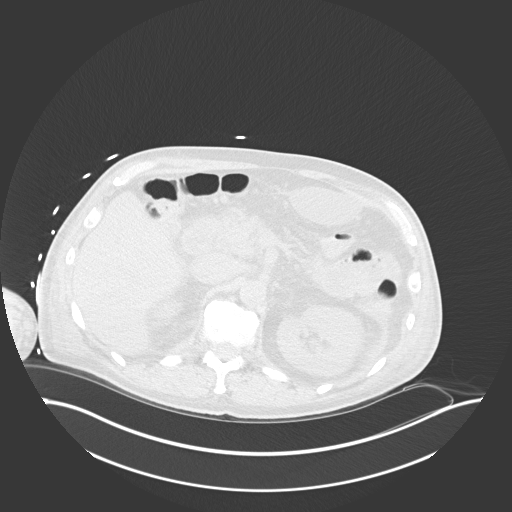
[im 92/113  bone]
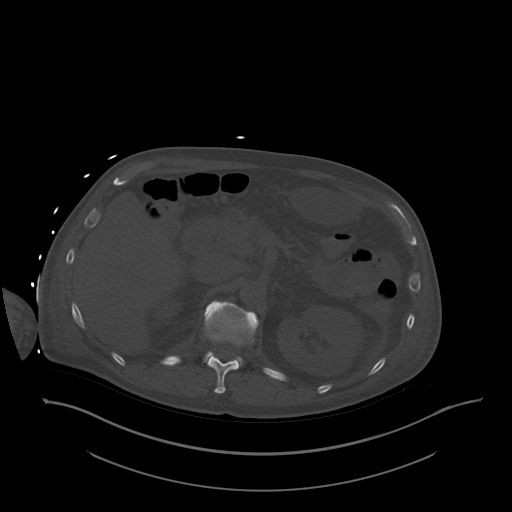
[im 102/113  soft-tissue]
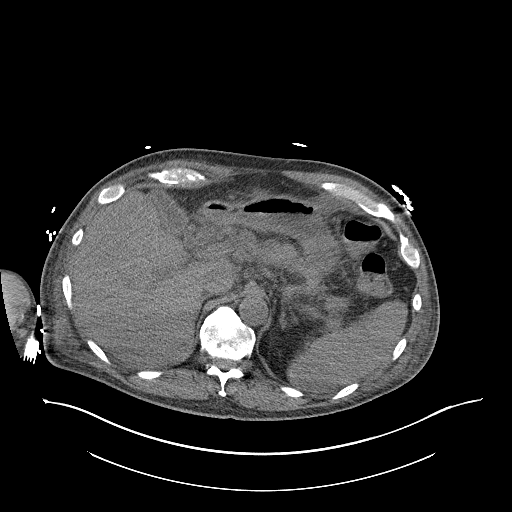
[im 102/113  lung]
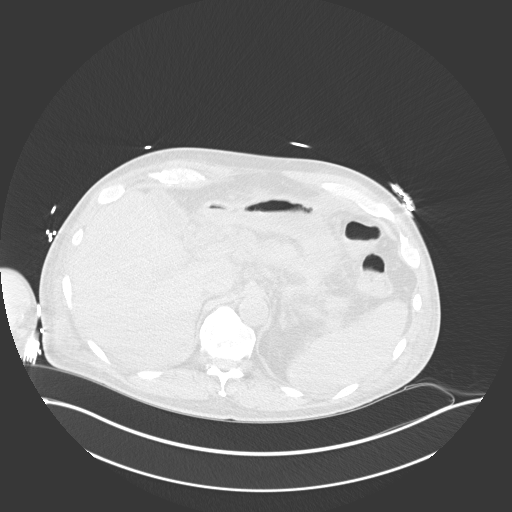

[Series 3: i-spiral 5.0 b40f · axial · 0.95mm/px · z∈[+810,+846]mm · 2 of 114 slices shown (2 of 2)]
[im 11/114  soft-tissue]
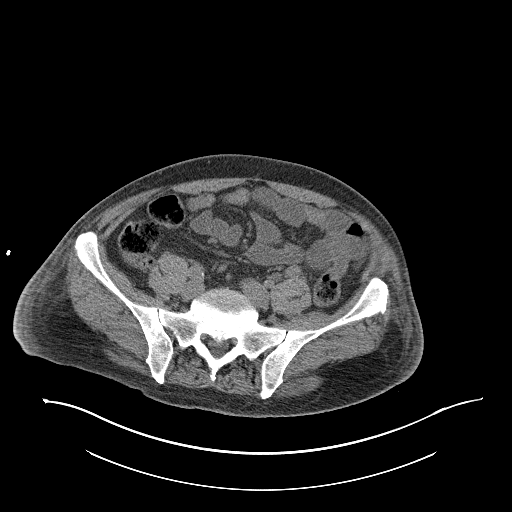
[im 21/114  soft-tissue]
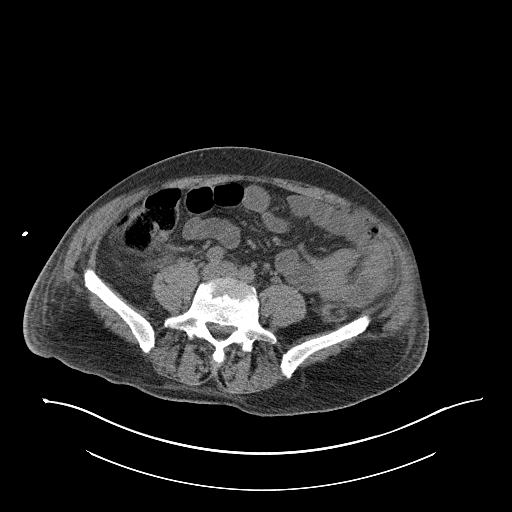

[12 of 32 positions shown; findings below may reference images not displayed]

EXAM:
CT GUIDED DRAINAGE OF ABDOMINAL ABSCESS

MEDICATIONS:
The patient is currently admitted to the hospital and receiving
intravenous antibiotics. The antibiotics were administered within an
appropriate time frame prior to the initiation of the procedure.

ANESTHESIA/SEDATION:
1.0 mg IV Versed 50 mcg IV Fentanyl

Moderate Sedation Time:  15 minutes

The patient was continuously monitored during the procedure by the
interventional radiology nurse under my direct supervision.

COMPLICATIONS:
None
PROCEDURE:
The operative field was prepped with chlorhexidine in a sterile
fashion, and a sterile drape was applied covering the operative
field. A sterile gown and sterile gloves were used for the
procedure. Local anesthesia was provided with 1% Lidocaine.

Patient was positioned supine position on the CT gantry table. Scout
CT was acquired for planning purposes.

The patient was then prepped and draped in the usual sterile
fashion. 1% lidocaine was used for local anesthesia. Using CT
guidance, trocar needle was advanced into the fluid collection of
the left abdomen. Modified Seldinger technique was then used to
place a 12 French biliary drain with additional sideholes into the
fluid collection. Fluid was aspirated and sent for culture and total
bilirubin level.

Final image was acquired.

Catheter was sutured in position and attached to bulb suction.

Patient tolerated the procedure well and remained hemodynamically
stable throughout.

No complications were encountered and no significant blood loss.
FINDINGS: Similar configuration of dumbbell shaped fluid within the left
abdomen.

Dark green bilious fluid was aspirated and a culture and T bili
fluid were sent.
IMPRESSION: Status post CT-guided drainage of left abdominal fluid collection.

## 2021-06-27 IMAGING — RF DG ERCP WO/W SPHINCTEROTOMY
1 series · 8 of 8 positions shown · non-contrast
Comparison: MRI 03/08/2020

CLINICAL DATA: Bile duct stone.

EXAM:
ERCP
TECHNIQUE: Multiple spot images obtained with the fluoroscopic device and
submitted for interpretation post-procedure.
FLUOROSCOPY TIME:  Fluoroscopy Time:  2 minutes and 43 seconds
Radiation Exposure Index (if provided by the fluoroscopic device):
31.84 mGy

[Series 1: unknown protocol · 0.20mm/px · 8 of 8 slices shown]
[im 1/8]
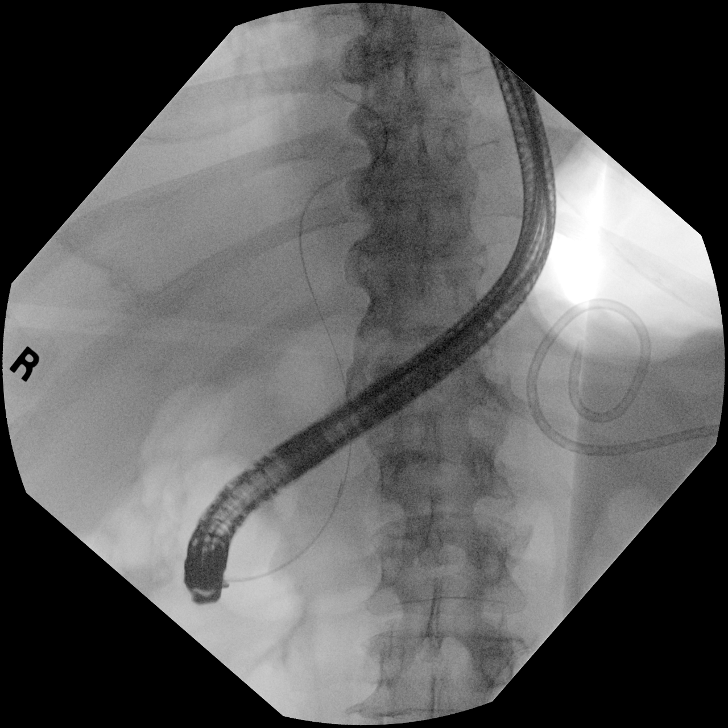
[im 2/8]
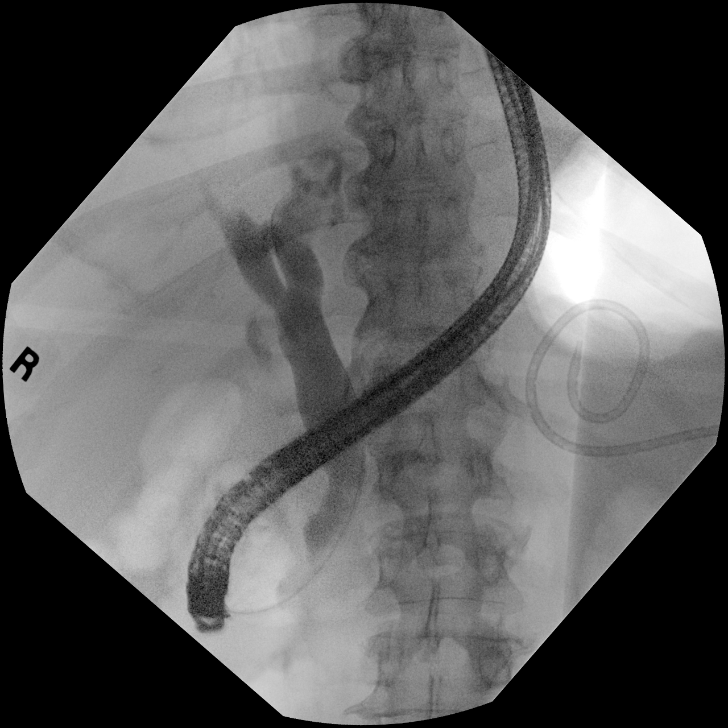
[im 3/8]
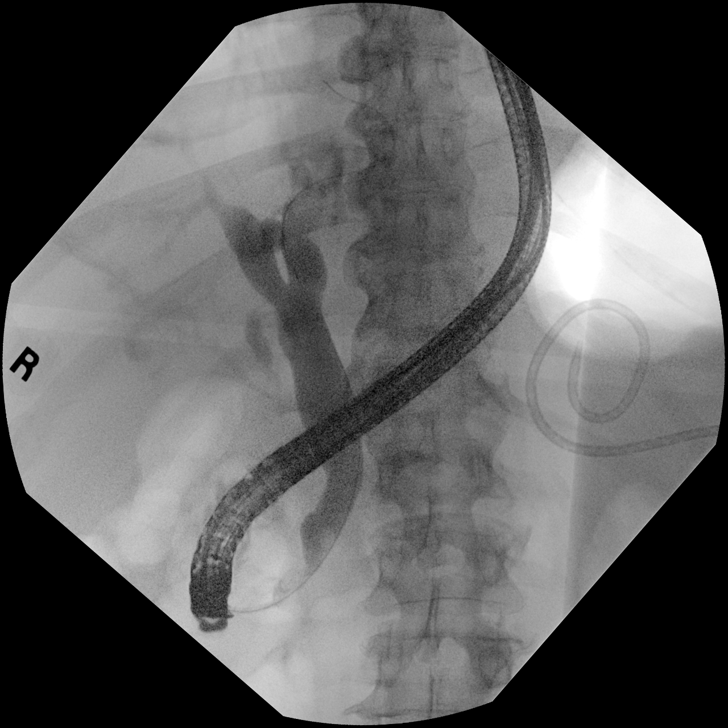
[im 4/8]
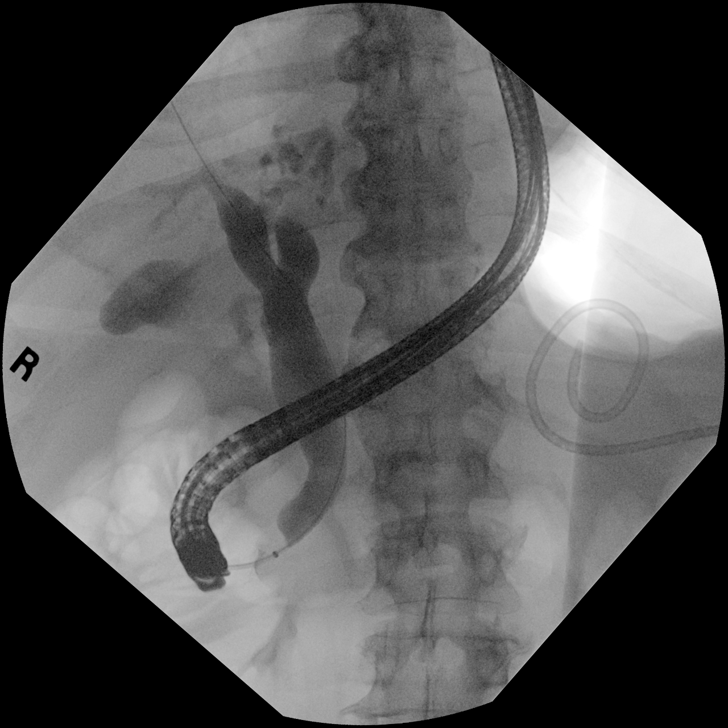
[im 5/8]
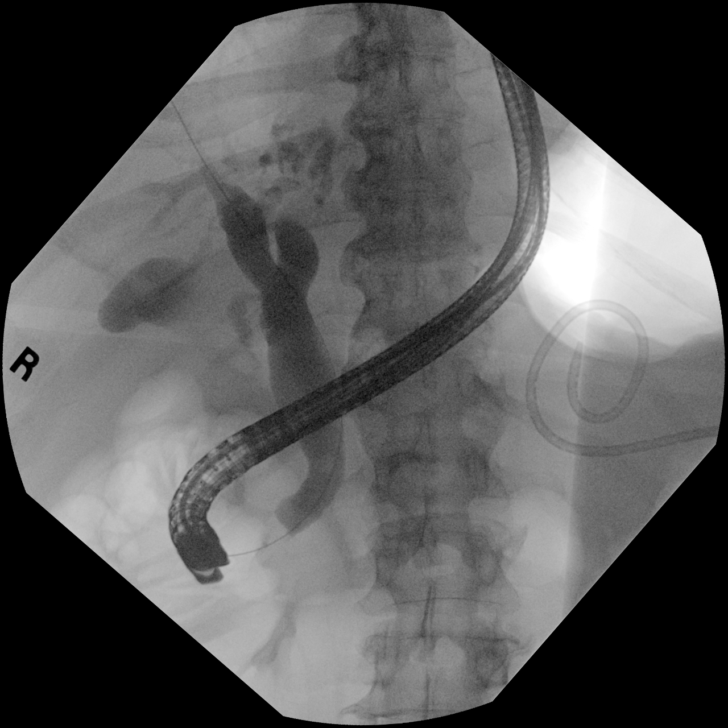
[im 6/8]
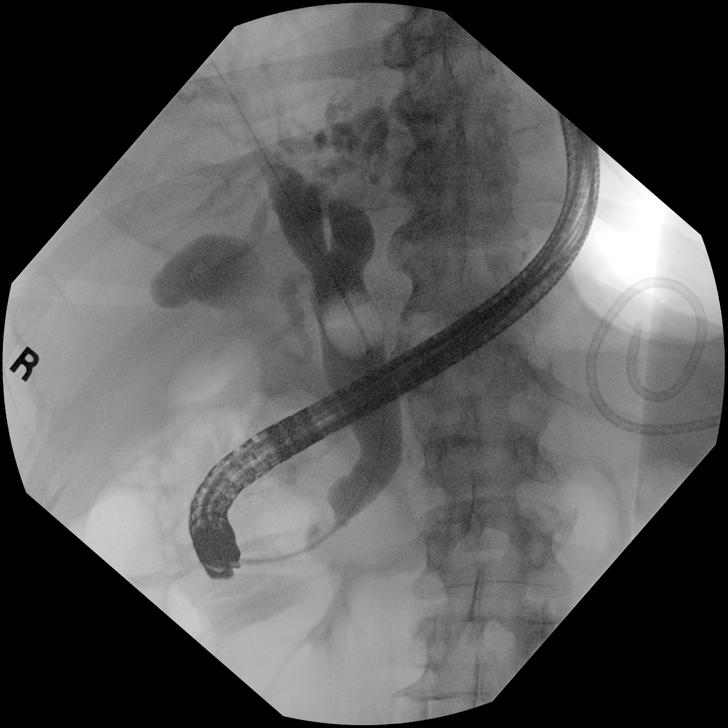
[im 7/8]
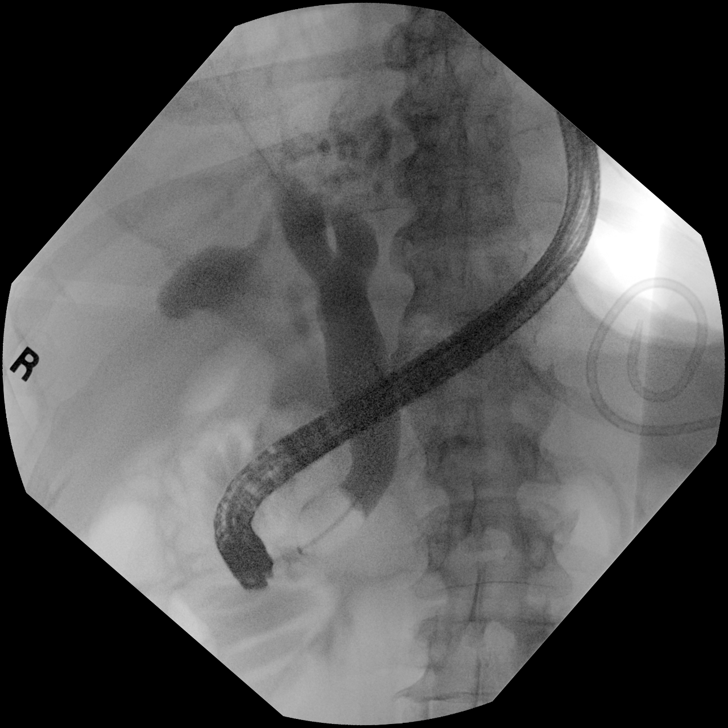
[im 8/8]
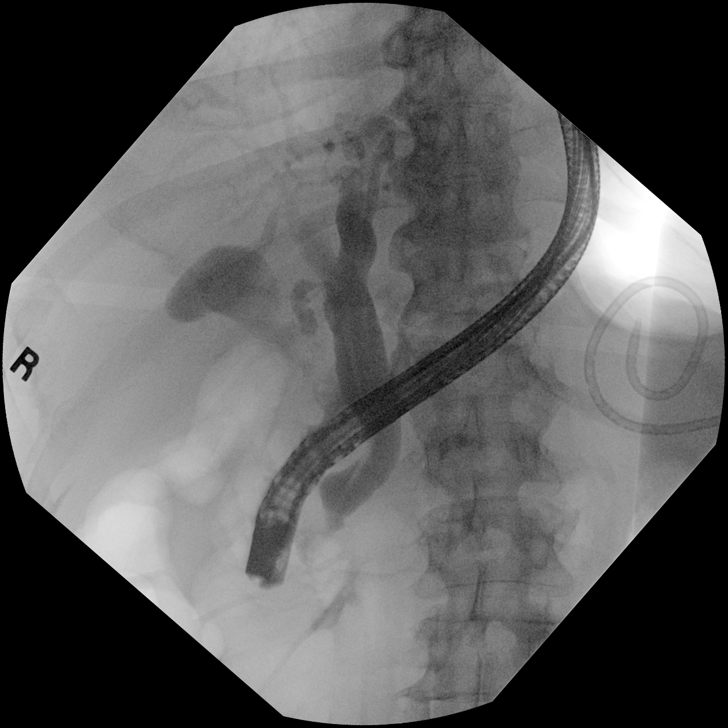

[8 of 8 positions shown; findings below may reference images not displayed]

FINDINGS: Retrograde cholangiogram demonstrates a dilated biliary system. Wire
was advanced into a left hepatic duct. Questionable small filling
defect in the distal common bile duct. Evidence for a balloon sweep.
Percutaneous drain in the left upper abdomen.
IMPRESSION: 1. Dilated biliary system.
2. Questionable filling defect in the distal common bile duct could
represent a small stone. Evidence for balloon sweep.
3. Patent cystic duct.

These images were submitted for radiologic interpretation only.
Please see the procedural report for the amount of contrast and the
fluoroscopy time utilized.
